# Patient Record
Sex: Male | Born: 1940 | Race: White | Hispanic: No | Smoking: Former smoker
Health system: Southern US, Community
[De-identification: ages and names within clinical notes are randomized; demographics above are authoritative.]

## PROBLEM LIST (undated history)

## (undated) DIAGNOSIS — Z79899 Other long term (current) drug therapy: Secondary | ICD-10-CM

## (undated) DIAGNOSIS — E119 Type 2 diabetes mellitus without complications: Secondary | ICD-10-CM

## (undated) DIAGNOSIS — D649 Anemia, unspecified: Secondary | ICD-10-CM

## (undated) DIAGNOSIS — IMO0002 Reserved for concepts with insufficient information to code with codable children: Secondary | ICD-10-CM

## (undated) DIAGNOSIS — J9600 Acute respiratory failure, unspecified whether with hypoxia or hypercapnia: Secondary | ICD-10-CM

## (undated) DIAGNOSIS — H538 Other visual disturbances: Secondary | ICD-10-CM

## (undated) DIAGNOSIS — K922 Gastrointestinal hemorrhage, unspecified: Secondary | ICD-10-CM

## (undated) DIAGNOSIS — H532 Diplopia: Secondary | ICD-10-CM

## (undated) DIAGNOSIS — G4753 Recurrent isolated sleep paralysis: Secondary | ICD-10-CM

## (undated) DIAGNOSIS — G893 Neoplasm related pain (acute) (chronic): Secondary | ICD-10-CM

## (undated) DIAGNOSIS — E1149 Type 2 diabetes mellitus with other diabetic neurological complication: Secondary | ICD-10-CM

## (undated) DIAGNOSIS — I10 Essential (primary) hypertension: Secondary | ICD-10-CM

## (undated) DIAGNOSIS — E669 Obesity, unspecified: Secondary | ICD-10-CM

## (undated) DIAGNOSIS — C439 Malignant melanoma of skin, unspecified: Secondary | ICD-10-CM

## (undated) DIAGNOSIS — R9439 Abnormal result of other cardiovascular function study: Secondary | ICD-10-CM

## (undated) DIAGNOSIS — E785 Hyperlipidemia, unspecified: Secondary | ICD-10-CM

## (undated) DIAGNOSIS — R6889 Other general symptoms and signs: Secondary | ICD-10-CM

## (undated) DIAGNOSIS — C799 Secondary malignant neoplasm of unspecified site: Secondary | ICD-10-CM

## (undated) DIAGNOSIS — J811 Chronic pulmonary edema: Secondary | ICD-10-CM

## (undated) DIAGNOSIS — J449 Chronic obstructive pulmonary disease, unspecified: Secondary | ICD-10-CM

## (undated) DIAGNOSIS — H02409 Unspecified ptosis of unspecified eyelid: Secondary | ICD-10-CM

## (undated) DIAGNOSIS — C4491 Basal cell carcinoma of skin, unspecified: Secondary | ICD-10-CM

## (undated) DIAGNOSIS — Z85820 Personal history of malignant melanoma of skin: Secondary | ICD-10-CM

## (undated) DIAGNOSIS — Z8719 Personal history of other diseases of the digestive system: Secondary | ICD-10-CM

## (undated) DIAGNOSIS — G7000 Myasthenia gravis without (acute) exacerbation: Secondary | ICD-10-CM

## (undated) DIAGNOSIS — Z9889 Other specified postprocedural states: Secondary | ICD-10-CM

## (undated) DIAGNOSIS — J189 Pneumonia, unspecified organism: Secondary | ICD-10-CM

## (undated) DIAGNOSIS — Z658 Other specified problems related to psychosocial circumstances: Secondary | ICD-10-CM

## (undated) HISTORY — DX: Chronic obstructive pulmonary disease, unspecified: J44.9

## (undated) HISTORY — PX: LITHOTRIPSY: SUR834

## (undated) HISTORY — DX: Anemia, unspecified: D64.9

## (undated) HISTORY — DX: Neoplasm related pain (acute) (chronic): G89.3

## (undated) HISTORY — DX: Pneumonia, unspecified organism: J18.9

## (undated) HISTORY — DX: Other specified postprocedural states: Z98.890

## (undated) HISTORY — DX: Diplopia: H53.2

## (undated) HISTORY — DX: Type 2 diabetes mellitus without complications: E11.9

## (undated) HISTORY — DX: Hyperlipidemia, unspecified: E78.5

## (undated) HISTORY — DX: Obesity, unspecified: E66.9

## (undated) HISTORY — PX: ENTERECTOMY: SHX306

## (undated) HISTORY — DX: Gastrointestinal hemorrhage, unspecified: K92.2

## (undated) HISTORY — DX: Personal history of malignant melanoma of skin: Z85.820

## (undated) HISTORY — DX: Myasthenia gravis without (acute) exacerbation: G70.00

## (undated) HISTORY — DX: Other general symptoms and signs: R68.89

## (undated) HISTORY — DX: Essential (primary) hypertension: I10

## (undated) HISTORY — DX: Personal history of other diseases of the digestive system: Z87.19

## (undated) HISTORY — DX: Secondary malignant neoplasm of unspecified site: C79.9

## (undated) HISTORY — DX: Other long term (current) drug therapy: Z79.899

## (undated) HISTORY — DX: Other visual disturbances: H53.8

## (undated) HISTORY — DX: Reserved for concepts with insufficient information to code with codable children: IMO0002

## (undated) HISTORY — DX: Acute respiratory failure, unspecified whether with hypoxia or hypercapnia: J96.00

## (undated) HISTORY — PX: RENAL ARTERY STENT: SHX2321

## (undated) HISTORY — PX: AXILLARY LYMPH NODE DISSECTION: SHX5229

## (undated) HISTORY — DX: Unspecified ptosis of unspecified eyelid: H02.409

## (undated) HISTORY — DX: Other specified problems related to psychosocial circumstances: Z65.8

## (undated) HISTORY — PX: BASAL CELL CARCINOMA EXCISION: SHX1214

## (undated) HISTORY — DX: Abnormal result of other cardiovascular function study: R94.39

## (undated) HISTORY — DX: Malignant melanoma of skin, unspecified: C43.9

## (undated) HISTORY — DX: Recurrent isolated sleep paralysis: G47.53

## (undated) HISTORY — DX: Basal cell carcinoma of skin, unspecified: C44.91

## (undated) HISTORY — DX: Chronic pulmonary edema: J81.1

## (undated) HISTORY — PX: MELANOMA EXCISION: SHX5266

## (undated) HISTORY — DX: Type 2 diabetes mellitus with other diabetic neurological complication: E11.49

---

## 1997-12-15 ENCOUNTER — Ambulatory Visit: Admission: RE | Admit: 1997-12-15 | Discharge: 1997-12-15 | Payer: Self-pay | Admitting: Family Medicine

## 2006-06-28 ENCOUNTER — Ambulatory Visit (HOSPITAL_COMMUNITY): Admission: RE | Admit: 2006-06-28 | Discharge: 2006-06-28 | Payer: Self-pay | Admitting: Urology

## 2007-01-24 DIAGNOSIS — C439 Malignant melanoma of skin, unspecified: Secondary | ICD-10-CM

## 2007-01-24 DIAGNOSIS — G7000 Myasthenia gravis without (acute) exacerbation: Secondary | ICD-10-CM

## 2007-01-24 HISTORY — DX: Myasthenia gravis without (acute) exacerbation: G70.00

## 2007-01-24 HISTORY — DX: Malignant melanoma of skin, unspecified: C43.9

## 2007-11-15 ENCOUNTER — Ambulatory Visit: Payer: Self-pay | Admitting: Vascular Surgery

## 2008-01-06 ENCOUNTER — Encounter: Admission: RE | Admit: 2008-01-06 | Discharge: 2008-01-06 | Payer: Self-pay | Admitting: Neurology

## 2008-01-13 ENCOUNTER — Ambulatory Visit (HOSPITAL_COMMUNITY): Admission: RE | Admit: 2008-01-13 | Discharge: 2008-01-13 | Payer: Self-pay | Admitting: Oncology

## 2010-06-07 NOTE — Consult Note (Signed)
NEW PATIENT CONSULTATION   Colin Reyes, Colin Reyes  DOB:  1940/03/10                                       11/15/2007  CHART#:[id number]   The patient presents today for evaluation of asymptomatic carotid  disease.  He is a 70 year old gentleman with multiple symptoms.  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  Has multiple complaints  currently.  He reports difficulty swallowing, difficulty with chewing,  diplopia, and blurred vision.  He does have shortness of breath and also  reports positional pain in his neck.   PAST MEDICAL HISTORY:  Significant for non-insulin-dependent diabetes,  hypertension, elevated cholesterol.  He is married with 2 children.  He  does not smoke, having quit in 1992.  Does not drink alcohol on a  regular basis.   REVIEW OF SYSTEMS:  Multiple positives with weight loss, loss of  appetite, shortness of breath with lying flat and with exertion,  difficulty swallowing, diarrhea, urinary frequency, slurred speech,  depression, nervousness, eyesight changes.   ALLERGIES:  Effexor.  Multiple medications, which are attached.   PHYSICAL EXAM:  He is a well developed, well nourished white male  appearing stated age of 16.  He does have generalized weakness.  He does  not have any focal deficits.  His carotid arteries are without bruits  bilaterally.  He has 2+ radial pulses bilaterally.   He had undergone MRA for evaluation of his neck pain and diplopia.  This  had shown internal carotid artery occlusion.  He underwent carotid  duplex in our office today, and this does confirm internal carotid  artery occlusion on the right.  He does have a patent common and  external carotid artery on the right.  On the left, he has no evidence  of significant internal carotid artery stenosis.  I discussed this at  length with the patient and his family present.  I explained that this  does not explain any of the symptoms that he is having, unfortunately.  I explained that this is a stable situation and that he had at some  point an asymptomatic occlusion of his internal carotid artery.  I  explained that since he has had no symptoms, that he clearly has  adequate cross flow from left to right and also from the posterior  circulation.  His duplex did show that he had antegrade vertebral flow  as well.  He is frustrated at the inability to determine the cause of  his multiple symptoms, but I reassured him that this has no relationship  to his carotid disease.  I would not recommend any additional followup  since this is a stable situation.  He will see [place] again on an as needed  basis.   Larina Earthly, M.D.  Electronically Signed   TFE/MEDQ  D:  11/15/2007  T:  11/18/2007  Job:  2012   cc:   Weston Settle, MD  Jacqualine Mau

## 2010-06-07 NOTE — Procedures (Signed)
CAROTID DUPLEX EXAM   INDICATION:  Right ICA occlusion by MRI, hypertension.   HISTORY:  Diabetes:  No.  Cardiac:  No.  Hypertension:  Yes.  Smoking:  Previous.  Previous Surgery:  Lymph nodes removed in left arm.  CV History:  Amaurosis Fugax No, Paresthesias No, Hemiparesis No                                       RIGHT             LEFT  Brachial systolic pressure:         100               110  Brachial Doppler waveforms:         Normal            Normal  Vertebral direction of flow:        Antegrade         Antegrade  DUPLEX VELOCITIES (cm/sec)  CCA peak systolic                   68                86  ECA peak systolic                   92                109  ICA peak systolic                   Occluded          83  ICA end diastolic                                     25  PLAQUE MORPHOLOGY:                  Heterogeneous     Heterogeneous  PLAQUE AMOUNT:                      Occlusive         Mild  PLAQUE LOCATION:                    ICA               ICA    IMPRESSION:  1. Occlusion of the right internal carotid artery.  2. A 1-39% stenosis of the left internal carotid artery.      ___________________________________________  Larina Earthly, M.D.   CH/MEDQ  D:  11/18/2007  T:  11/18/2007  Job:  [id number]

## 2010-10-28 LAB — GLUCOSE, CAPILLARY: Glucose-Capillary: 152 mg/dL — ABNORMAL HIGH (ref 70–99)

## 2010-11-10 LAB — BASIC METABOLIC PANEL
CO2: 29
Chloride: 102
GFR calc Af Amer: >60
Potassium: 4
Sodium: 135

## 2011-12-05 DIAGNOSIS — E1149 Type 2 diabetes mellitus with other diabetic neurological complication: Secondary | ICD-10-CM

## 2011-12-05 DIAGNOSIS — H532 Diplopia: Secondary | ICD-10-CM

## 2011-12-05 DIAGNOSIS — C439 Malignant melanoma of skin, unspecified: Secondary | ICD-10-CM

## 2011-12-05 DIAGNOSIS — H538 Other visual disturbances: Secondary | ICD-10-CM

## 2011-12-05 DIAGNOSIS — C799 Secondary malignant neoplasm of unspecified site: Secondary | ICD-10-CM | POA: Insufficient documentation

## 2011-12-05 DIAGNOSIS — R6889 Other general symptoms and signs: Secondary | ICD-10-CM | POA: Insufficient documentation

## 2011-12-05 DIAGNOSIS — I10 Essential (primary) hypertension: Secondary | ICD-10-CM | POA: Insufficient documentation

## 2011-12-05 DIAGNOSIS — H02409 Unspecified ptosis of unspecified eyelid: Secondary | ICD-10-CM

## 2011-12-05 DIAGNOSIS — G4753 Recurrent isolated sleep paralysis: Secondary | ICD-10-CM | POA: Insufficient documentation

## 2011-12-05 DIAGNOSIS — J449 Chronic obstructive pulmonary disease, unspecified: Secondary | ICD-10-CM

## 2011-12-05 HISTORY — DX: Chronic obstructive pulmonary disease, unspecified: J44.9

## 2011-12-05 HISTORY — DX: Other visual disturbances: H53.8

## 2011-12-05 HISTORY — DX: Malignant melanoma of skin, unspecified: C43.9

## 2011-12-05 HISTORY — DX: Unspecified ptosis of unspecified eyelid: H02.409

## 2011-12-05 HISTORY — DX: Recurrent isolated sleep paralysis: G47.53

## 2011-12-05 HISTORY — DX: Type 2 diabetes mellitus with other diabetic neurological complication: E11.49

## 2011-12-05 HISTORY — DX: Other general symptoms and signs: R68.89

## 2011-12-05 HISTORY — DX: Secondary malignant neoplasm of unspecified site: C79.9

## 2011-12-05 HISTORY — DX: Diplopia: H53.2

## 2011-12-05 HISTORY — DX: Essential (primary) hypertension: I10

## 2012-05-21 ENCOUNTER — Other Ambulatory Visit: Payer: Self-pay

## 2012-05-21 MED ORDER — PYRIDOSTIGMINE BROMIDE 60 MG PO TABS: 60.0000 mg | ORAL_TABLET | Freq: Three times a day (TID) | ORAL | Status: DC

## 2012-05-21 NOTE — Telephone Encounter (Signed)
Former Dr Sandria Manly patient assigned to Dr Anne Hahn.

## 2012-06-13 ENCOUNTER — Ambulatory Visit (INDEPENDENT_AMBULATORY_CARE_PROVIDER_SITE_OTHER): Payer: No Typology Code available for payment source | Admitting: Neurology

## 2012-06-13 ENCOUNTER — Encounter: Payer: Self-pay | Admitting: Neurology

## 2012-06-13 VITALS — BP 121/62 | HR 61 | Wt 210.0 lb

## 2012-06-13 DIAGNOSIS — H538 Other visual disturbances: Secondary | ICD-10-CM

## 2012-06-13 DIAGNOSIS — G4753 Recurrent isolated sleep paralysis: Secondary | ICD-10-CM

## 2012-06-13 DIAGNOSIS — I10 Essential (primary) hypertension: Secondary | ICD-10-CM

## 2012-06-13 DIAGNOSIS — G7000 Myasthenia gravis without (acute) exacerbation: Secondary | ICD-10-CM

## 2012-06-13 DIAGNOSIS — J449 Chronic obstructive pulmonary disease, unspecified: Secondary | ICD-10-CM

## 2012-06-13 DIAGNOSIS — R6889 Other general symptoms and signs: Secondary | ICD-10-CM

## 2012-06-13 DIAGNOSIS — C439 Malignant melanoma of skin, unspecified: Secondary | ICD-10-CM

## 2012-06-13 DIAGNOSIS — E1149 Type 2 diabetes mellitus with other diabetic neurological complication: Secondary | ICD-10-CM

## 2012-06-13 DIAGNOSIS — H02409 Unspecified ptosis of unspecified eyelid: Secondary | ICD-10-CM

## 2012-06-13 DIAGNOSIS — H532 Diplopia: Secondary | ICD-10-CM

## 2012-06-13 NOTE — Progress Notes (Signed)
Reason for visit: Myasthenia gravis  Colin Reyes is an 72 y.o. male  History of present illness:  Colin Reyes is a 72 year old right-handed white male with a history of myasthenia gravis. The patient presented with a "dropped head syndrome", dysphagia, and some generalized weakness along with double vision and chewing difficulties. The patient had difficulty moving his tongue. The patient presented in 2009, and he is been treated solely with Mestinon. The patient has done relatively well, and he has been stable for a number of years. The patient has had some issues with a chest wall melanoma that has been treated through [facility]. The patient has had a good response to chemotherapy. The patient has had some bowel issues on Mestinon, and he is taking hyoscyamine for this. The patient indicates that he has fatigue with holding up his head, and he will have to rest at times to keep his head up. The patient reports some intermittent left-sided neck and shoulder discomfort that is neuromuscular in nature. The patient takes narcotic medications for pain for his chest wall pain and neck pain. The patient denies any issues with falls or stumbles. The patient indicates that he is able to climb stairs well. The patient denies any dysphagia at this time.  Past Medical History  Diagnosis Date  . Hypertension   . Diabetes mellitus   . Melanoma 2009    Left chest wall   . Myasthenia gravis 2009  . Obesity     Past Surgical History  Procedure Laterality Date  . Axillary lymph node dissection      Family History  Problem Relation Age of Onset  . Cancer - Lung Father     Social history:  reports that he quit smoking about 22 years ago. He does not have any smokeless tobacco history on file. He reports that he does not drink alcohol or use illicit drugs.  Allergies:  Allergies  Allergen Reactions  . Effexor (Venlafaxine Hcl)     Medications:  Current Outpatient Prescriptions on File Prior  to Visit  Medication Sig Dispense Refill  . pyridostigmine (MESTINON) 60 MG tablet Take 1 tablet (60 mg total) by mouth 3 (three) times daily.  270 tablet  1   No current facility-administered medications on file prior to visit.    ROS:  Out of a complete 14 system review of symptoms, the patient complains only of the following symptoms, and all other reviewed systems are negative.  Fatigue Swelling in the legs Ringing in the ears Shortness of breath Joint pain Skin sensitivity  Blood pressure 121/62, pulse 61, weight 210 lb (95.255 kg).  Physical Exam  General: The patient is alert and cooperative at the time of the examination. The patient is moderately obese.  Skin: 1+ edema was seen at the ankles bilaterally.   Neurologic Exam  Cranial nerves: Facial symmetry is present. Speech is normal, no aphasia or dysarthria is noted. Extraocular movements are full. Visual fields are full. With superior gaze for 1 minute, there is no ptosis or divergence of gaze. The patient does not report double vision.  Motor: The patient has good strength in all 4 extremities. With arms abducted for 1 minute, there is mild fatigable weakness in the left deltoid.  Coordination: The patient has good finger-nose-finger and heel-to-shin bilaterally.  Gait and station: The patient has a normal gait. Tandem gait is slightly unsteady. Romberg is negative. No drift is seen.  Reflexes: Deep tendon reflexes are symmetric, but the ankle jerk  reflexes are depressed bilaterally.   Assessment/Plan:  One. Myasthenia gravis  2. Chest wall melanoma  The patient is doing relatively well at this point solely on Mestinon. We will continue the medication for now. The patient followup in 6 months.  Marlan Palau MD 06/14/2012 11:00 AM  [facility] [address] [place], [place] [address]  Phone [phone] Fax [phone]

## 2012-07-17 ENCOUNTER — Telehealth: Payer: Self-pay

## 2012-07-17 MED ORDER — HYOSCYAMINE SULFATE 0.125 MG PO TBDP: 0.1250 mg | ORAL_TABLET | Freq: Two times a day (BID) | ORAL | Status: DC

## 2012-07-17 MED ORDER — PYRIDOSTIGMINE BROMIDE 60 MG PO TABS: 60.0000 mg | ORAL_TABLET | Freq: Three times a day (TID) | ORAL | Status: DC

## 2012-07-17 NOTE — Telephone Encounter (Signed)
Former [facility] patient assigned to Dr Anne Hahn.  He called because Dr Sandria Manly always prescribed generic Anaspaz for him and he needs a refill.

## 2012-07-23 ENCOUNTER — Other Ambulatory Visit: Payer: Self-pay

## 2012-07-23 MED ORDER — HYOSCYAMINE SULFATE 0.125 MG PO TBDP: 0.1250 mg | ORAL_TABLET | Freq: Two times a day (BID) | ORAL | Status: DC

## 2012-07-23 NOTE — Telephone Encounter (Signed)
Patient called, left message asking that [facility] due to cost.

## 2012-08-28 ENCOUNTER — Other Ambulatory Visit: Payer: Self-pay

## 2012-09-30 DIAGNOSIS — J811 Chronic pulmonary edema: Secondary | ICD-10-CM | POA: Insufficient documentation

## 2012-09-30 DIAGNOSIS — E119 Type 2 diabetes mellitus without complications: Secondary | ICD-10-CM | POA: Insufficient documentation

## 2012-09-30 DIAGNOSIS — J189 Pneumonia, unspecified organism: Secondary | ICD-10-CM | POA: Insufficient documentation

## 2012-09-30 DIAGNOSIS — G7000 Myasthenia gravis without (acute) exacerbation: Secondary | ICD-10-CM | POA: Insufficient documentation

## 2012-09-30 DIAGNOSIS — Z85820 Personal history of malignant melanoma of skin: Secondary | ICD-10-CM | POA: Insufficient documentation

## 2012-09-30 DIAGNOSIS — Z8719 Personal history of other diseases of the digestive system: Secondary | ICD-10-CM

## 2012-09-30 DIAGNOSIS — J9600 Acute respiratory failure, unspecified whether with hypoxia or hypercapnia: Secondary | ICD-10-CM

## 2012-09-30 DIAGNOSIS — Z9889 Other specified postprocedural states: Secondary | ICD-10-CM

## 2012-09-30 HISTORY — DX: Type 2 diabetes mellitus without complications: E11.9

## 2012-09-30 HISTORY — DX: Pneumonia, unspecified organism: J18.9

## 2012-09-30 HISTORY — DX: Personal history of other diseases of the digestive system: Z87.19

## 2012-09-30 HISTORY — DX: Other specified postprocedural states: Z98.890

## 2012-09-30 HISTORY — DX: Personal history of malignant melanoma of skin: Z85.820

## 2012-09-30 HISTORY — DX: Acute respiratory failure, unspecified whether with hypoxia or hypercapnia: J96.00

## 2012-09-30 HISTORY — DX: Chronic pulmonary edema: J81.1

## 2012-10-27 HISTORY — PX: UMBILICAL HERNIA REPAIR: SHX196

## 2012-11-28 ENCOUNTER — Other Ambulatory Visit: Payer: Self-pay

## 2012-12-15 ENCOUNTER — Other Ambulatory Visit: Payer: Self-pay

## 2012-12-15 MED ORDER — PYRIDOSTIGMINE BROMIDE 60 MG PO TABS: 60.0000 mg | ORAL_TABLET | Freq: Three times a day (TID) | ORAL | Status: DC

## 2012-12-16 ENCOUNTER — Encounter: Payer: Self-pay | Admitting: Neurology

## 2012-12-16 ENCOUNTER — Ambulatory Visit (INDEPENDENT_AMBULATORY_CARE_PROVIDER_SITE_OTHER): Payer: No Typology Code available for payment source | Admitting: Neurology

## 2012-12-16 VITALS — BP 121/64 | HR 63 | Ht 69.0 in | Wt 201.0 lb

## 2012-12-16 DIAGNOSIS — G7000 Myasthenia gravis without (acute) exacerbation: Secondary | ICD-10-CM

## 2012-12-16 MED ORDER — PYRIDOSTIGMINE BROMIDE ER 180 MG PO TBCR: 180.0000 mg | EXTENDED_RELEASE_TABLET | Freq: Every day | ORAL | Status: DC

## 2012-12-16 NOTE — Progress Notes (Signed)
Reason for visit: Myasthenia gravis  Colin Reyes is an 72 y.o. male  History of present illness:  Colin Reyes is a 72 year old right-handed white male with a history of myasthenia gravis that initially presented with a "dropped head syndrome". The patient has been on Mestinon taking 180 mg tablet at night, and 60 mg 3 times daily. The patient takes hyoscyamine once a day to help prevent diarrhea. The patient recently was in hospital for an umbilical hernia repair. The patient received paralytics with the surgery, and he had a protracted hospitalization secondary to difficulty weaning him from the ventilator. The patient went to [facility] following the surgery. The patient has recovered completely. Recently, the patient had resection of basal cell carcinoma on the lower extremities. The patient otherwise is doing well at this time with the myasthenia gravis without double vision, ptosis, difficulty chewing or swallowing, or weakness of the extremities. The patient returns for an evaluation.  Past Medical History  Diagnosis Date  . Hypertension   . Diabetes mellitus   . Melanoma 2009    Left chest wall   . Myasthenia gravis 2009  . Obesity     Past Surgical History  Procedure Laterality Date  . Axillary lymph node dissection    . Umbilical hernia repair  10/27/12  . Basal cell carcinoma excision  11/08/12, 11/29/12    Family History  Problem Relation Age of Onset  . Cancer - Lung Father     Social history:  reports that he quit smoking about 22 years ago. He does not have any smokeless tobacco history on file. He reports that he does not drink alcohol or use illicit drugs.    Allergies  Allergen Reactions  . Effexor [Venlafaxine Hcl]     Medications:  Current Outpatient Prescriptions on File Prior to Visit  Medication Sig Dispense Refill  . ADVAIR DISKUS 250-50 MCG/DOSE AEPB Inhale 250 puffs into the lungs 2 (two) times daily.      Marland Kitchen atenolol (TENORMIN) 50 MG tablet Take 50  mg by mouth daily.      . AVODART 0.5 MG capsule Take 0.5 mg by mouth daily.      . Cholecalciferol (VITAMIN D) 2000 UNITS CAPS Take 2,000 Units by mouth daily.      Marland Kitchen glipiZIDE (GLUCOTROL XL) 10 MG 24 hr tablet Take 10 mg by mouth 2 (two) times daily.      . hydrochlorothiazide (MICROZIDE) 12.5 MG capsule Take 12.5 mg by mouth daily.      . hyoscyamine (ANASPAZ) 0.125 MG TBDP Place 1 tablet (0.125 mg total) under the tongue 2 (two) times daily.  180 tablet  1  . LORazepam (ATIVAN) 1 MG tablet Take 1 mg by mouth daily.      Marland Kitchen losartan (COZAAR) 50 MG tablet Take 50 mg by mouth daily.      Marland Kitchen morphine (MS CONTIN) 15 MG 12 hr tablet Take 15 mg by mouth 2 (two) times daily.       . ONE TOUCH ULTRA TEST test strip       . oxyCODONE-acetaminophen (PERCOCET/ROXICET) 5-325 MG per tablet Take 5-325 tablets by mouth as needed.      . pyridostigmine (MESTINON) 60 MG tablet Take 1 tablet (60 mg total) by mouth 3 (three) times daily.  270 tablet  1  . simvastatin (ZOCOR) 20 MG tablet Take 20 mg by mouth daily.      Marland Kitchen terazosin (HYTRIN) 1 MG capsule Take 1 mg by mouth  daily.       No current facility-administered medications on file prior to visit.    ROS:  Out of a complete 14 system review of symptoms, the patient complains only of the following symptoms, and all other reviewed systems are negative.  Swelling the legs Easy bruising  Blood pressure 121/64, pulse 63, height 5\' 9"  (1.753 m), weight 201 lb (91.173 kg).  Physical Exam  General: The patient is alert and cooperative at the time of the examination.  Skin: 1-2+ edema of ankles is noted bilaterally.   Neurologic Exam  Mental status: The patient is oriented x 3.  Cranial nerves: Facial symmetry is present. Speech is normal, no aphasia or dysarthria is noted. Extraocular movements are full. Visual fields are full. With superior gaze for 1 minute, no double vision or ptosis is noted. The patient has good strength with jaw opening and  closure, and good facial muscle strength, and good strength with hip flexion and extension.  Motor: The patient has good strength in all 4 extremities. With the arms outstretched for 1 minute, no fatigable weakness of the deltoid muscles was noted.  Sensory examination: Soft touch sensation on the face and arms is symmetric.  Coordination: The patient has good finger-nose-finger and heel-to-shin bilaterally.  Gait and station: The patient has a normal gait. Tandem gait is slightly unsteady. Romberg is negative. No drift is seen.  Reflexes: Deep tendon reflexes are symmetric.   Assessment/Plan:  One. Myasthenia gravis  The patient doing well at this time. The patient will be continued on his Mestinon taking 60 mg 3 times daily, and 180 mg at night. The patient will followup through this office in about 6-7 months. The patient is being considered for a shingles vaccination, which is not contraindicated with his history of myasthenia gravis.  Colin Palau MD 12/16/2012 9:24 AM  [facility] [address] [place], [place] [address]  Phone [phone] Fax [phone]

## 2012-12-16 NOTE — Patient Instructions (Signed)
Myasthenia Gravis Myasthenia gravis is a disease that causes muscle weakness throughout the body. The muscles affected are the ones we can control (voluntary muscles). An example of a voluntary muscle is your hand muscles. You can control the muscles to make the hand pick something up. An example of an involuntary muscle is the heart. The heart beats without any direction from you.  Myasthenia Gravis is thought to be an autoimmune disease. That means that normal defenses of the body begin to attack the body. In this case, the immune system begins to attack cells located at the junctions of the muscles and the nerves. Women are affected more often. Women are affected at a younger age than men. Babies born to affected women frequently develop symptoms at an early age. SYMPTOMS Initially in the disease, the facial muscles are affected first. After this, a person may develop droopy eyelids. They may have difficulty controlling facial muscles. They may have problems chewing. Swallowing and speaking may become impaired. The weakness gradually spreads to the arms and legs. It begins to affect breathing. Sometimes, the symptoms lessen or go away without any apparent cause. DIAGNOSIS  Diagnosis can be made with blood tests. Tests such as electromyography may be done to examine the electrical activity in the muscle. An improvement in symptoms after having an anti-cholinesterase drug helps confirm the diagnosis.  TREATMENT  Medicines are usually prescribed as the first treatment. These medicines help, but they do not cure the disease. A plasma cleansing procedure (plasmapheresis) can be used to treat a crisis. It can also be used to prepare a person for surgery. This procedure produces short-term improvement. Some cases are helped by removing the thymus gland. Steroids are used for short-term benefits. Document Released: 04/17/2000 Document Revised: 04/03/2011 Document Reviewed: 01/09/2005 [employer] Patient  Information 2014 [employer].  

## 2013-03-25 ENCOUNTER — Telehealth: Payer: Self-pay | Admitting: Neurology

## 2013-03-25 MED ORDER — PYRIDOSTIGMINE BROMIDE ER 180 MG PO TBCR: 180.0000 mg | EXTENDED_RELEASE_TABLET | Freq: Every day | ORAL | Status: DC

## 2013-03-25 MED ORDER — PYRIDOSTIGMINE BROMIDE 60 MG PO TABS: 60.0000 mg | ORAL_TABLET | Freq: Three times a day (TID) | ORAL | Status: DC

## 2013-03-25 NOTE — Telephone Encounter (Signed)
Pt called states he needs to get his refill on his pyridostigmine (MESTINON) 180 MG CR tablet. Pt states he [facility] longer gets [facility] I have went under demographics and changed the pharmacy. Pt wants someone to call him once this is has been sent to the pharmacy.

## 2013-03-25 NOTE — Telephone Encounter (Signed)
Rx has been sent.  I called the patient back.  He is aware [facility].

## 2013-06-18 ENCOUNTER — Ambulatory Visit (INDEPENDENT_AMBULATORY_CARE_PROVIDER_SITE_OTHER): Payer: Medicare HMO | Admitting: Neurology

## 2013-06-18 ENCOUNTER — Encounter: Payer: Self-pay | Admitting: Neurology

## 2013-06-18 VITALS — BP 122/65 | HR 62 | Wt 198.0 lb

## 2013-06-18 DIAGNOSIS — G7000 Myasthenia gravis without (acute) exacerbation: Secondary | ICD-10-CM

## 2013-06-18 NOTE — Progress Notes (Signed)
Reason for visit: Myasthenia gravis  Colin Reyes is an 73 y.o. male  History of present illness:  Colin Reyes is a 73 year old right-handed white male with a history of myasthenia gravis that presented with a "dropped head syndrome". Currently, the patient is being treated only with Mestinon. He has a history of a lymphoma involving the left arm, and he has some chronic discomfort involving the left axillary area. The patient also reports some left shoulder discomfort if he has been physically active for a period of time. He indicates that there is no pain going down the left arm. He occasionally will note some ptosis of the eyes, but this does not shut down the eyes completely. He rarely has issues with double vision. He reports no problems with chewing or swallowing. He denies any further issues with the "dropped head syndrome". He returns to the office today for an evaluation. No other new medical issues have come up since last seen.   Past Medical History  Diagnosis Date  . Hypertension   . Diabetes mellitus   . Melanoma 2009    Left chest wall   . Myasthenia gravis 2009  . Obesity   . Dyslipidemia     Past Surgical History  Procedure Laterality Date  . Axillary lymph node dissection    . Umbilical hernia repair  10/27/12  . Basal cell carcinoma excision  11/08/12, 11/29/12    Family History  Problem Relation Age of Onset  . Cancer - Lung Father   . Cancer - Prostate Brother     Social history:  reports that he quit smoking about 23 years ago. He does not have any smokeless tobacco history on file. He reports that he does not drink alcohol or use illicit drugs.    Allergies  Allergen Reactions  . Effexor [Venlafaxine Hcl]     Medications:  Current Outpatient Prescriptions on File Prior to Visit  Medication Sig Dispense Refill  . ADVAIR DISKUS 250-50 MCG/DOSE AEPB Inhale 250 puffs into the lungs 2 (two) times daily.      Marland Kitchen atenolol (TENORMIN) 50 MG tablet Take 50  mg by mouth daily.      . AVODART 0.5 MG capsule Take 0.5 mg by mouth daily.      . Cholecalciferol (VITAMIN D) 2000 UNITS CAPS Take 2,000 Units by mouth daily.      Marland Kitchen glipiZIDE (GLUCOTROL XL) 10 MG 24 hr tablet Take 10 mg by mouth 2 (two) times daily.      . hydrochlorothiazide (MICROZIDE) 12.5 MG capsule Take 12.5 mg by mouth daily.      . hyoscyamine (ANASPAZ) 0.125 MG TBDP Place 1 tablet (0.125 mg total) under the tongue 2 (two) times daily.  180 tablet  1  . LORazepam (ATIVAN) 1 MG tablet Take 1 mg by mouth daily.      Marland Kitchen losartan (COZAAR) 50 MG tablet Take 50 mg by mouth daily.      Marland Kitchen morphine (MS CONTIN) 15 MG 12 hr tablet Take 15 mg by mouth 2 (two) times daily.       . ONE TOUCH ULTRA TEST test strip       . oxyCODONE-acetaminophen (PERCOCET/ROXICET) 5-325 MG per tablet Take 5-325 tablets by mouth as needed.      . Probiotic Product (PROBIOTIC DAILY PO) Take by mouth daily.      Marland Kitchen pyridostigmine (MESTINON) 180 MG CR tablet Take 1 tablet (180 mg total) by mouth at bedtime.  90 tablet  1  . pyridostigmine (MESTINON) 60 MG tablet Take 1 tablet (60 mg total) by mouth 3 (three) times daily.  270 tablet  1  . Saxagliptin-Metformin (KOMBIGLYZE XR) 2.05-998 MG TB24 Take 2.5 mg by mouth daily.      . simvastatin (ZOCOR) 20 MG tablet Take 20 mg by mouth daily.      Marland Kitchen terazosin (HYTRIN) 1 MG capsule Take 1 mg by mouth daily.       No current facility-administered medications on file prior to visit.    ROS:  Out of a complete 14 system review of symptoms, the patient complains only of the following symptoms, and all other reviewed systems are negative.  Ringing in the ears  Leg swelling Dizziness, weakness Neck pain Easy bruising  Blood pressure 122/65, pulse 62, weight 198 lb (89.812 kg).  Physical Exam  General: The patient is alert and cooperative at the time of the examination.  Neuromuscular: Range of movement of the cervical spine lacks about 10 of full lateral rotation  bilaterally.  Skin: No significant peripheral edema is noted.   Neurologic Exam  Mental status: The patient is oriented x 3.  Cranial nerves: Facial symmetry is present. Speech is normal, no aphasia or dysarthria is noted. Extraocular movements are full. Visual fields are full. With superior gaze for 1 minute, the patient reports some subjective double vision after 45 seconds, no ptosis is seen. The patient has good strength of the facial muscles, and the muscles with head flexion and extension. Jaw muscle strength is normal.  Motor: The patient has good strength in all 4 extremities.  With the arms outstretched for 1 minute, no fatigable weakness of the deltoid muscles is seen on either side.   Sensory examination: soft touch sensation is symmetric on the face, arms, and legs.   Coordination: The patient has good finger-nose-finger and heel-to-shin bilaterally.  Gait and station: The patient has a normal gait. Tandem gait is normal. Romberg is negative. No drift is seen.  Reflexes: Deep tendon reflexes are symmetric.   Assessment/Plan:  One. Myasthenia gravis  The patient is stable with his myasthenia currently. He will continue his current dosing of the Mestinon. The patient will followup in 6 months. The left-sided neck pain issues are likely not related to the myasthenia gravis.   Colin Alexanders MD 06/18/2013 9:16 AM  [facility] [address] [place], [place] [address]  Phone [phone] Fax [phone]

## 2013-06-18 NOTE — Patient Instructions (Signed)
Myasthenia Gravis Myasthenia gravis is a disease that causes muscle weakness throughout the body. The muscles affected are the ones we can control (voluntary muscles). An example of a voluntary muscle is your hand muscles. You can control the muscles to make the hand pick something up. An example of an involuntary muscle is the heart. The heart beats without any direction from you.  Myasthenia Gravis is thought to be an autoimmune disease. That means that normal defenses of the body begin to attack the body. In this case, the immune system begins to attack cells located at the junctions of the muscles and the nerves. Women are affected more often. Women are affected at a younger age than men. Babies born to affected women frequently develop symptoms at an early age. SYMPTOMS Initially in the disease, the facial muscles are affected first. After this, a person may develop droopy eyelids. They may have difficulty controlling facial muscles. They may have problems chewing. Swallowing and speaking may become impaired. The weakness gradually spreads to the arms and legs. It begins to affect breathing. Sometimes, the symptoms lessen or go away without any apparent cause. DIAGNOSIS  Diagnosis can be made with blood tests. Tests such as electromyography may be done to examine the electrical activity in the muscle. An improvement in symptoms after having an anti-cholinesterase drug helps confirm the diagnosis.  TREATMENT  Medicines are usually prescribed as the first treatment. These medicines help, but they do not cure the disease. A plasma cleansing procedure (plasmapheresis) can be used to treat a crisis. It can also be used to prepare a person for surgery. This procedure produces short-term improvement. Some cases are helped by removing the thymus gland. Steroids are used for short-term benefits. Document Released: 04/17/2000 Document Revised: 04/03/2011 Document Reviewed: 01/09/2005 [employer] Patient  Information 2014 [employer].  

## 2013-06-30 ENCOUNTER — Telehealth: Payer: Self-pay | Admitting: Neurology

## 2013-06-30 NOTE — Telephone Encounter (Signed)
[facility] calling [facility] [address] -[phone]

## 2013-07-07 MED ORDER — HYOSCYAMINE SULFATE 0.125 MG PO TBDP: 0.1250 mg | ORAL_TABLET | Freq: Two times a day (BID) | ORAL | Status: DC

## 2013-07-07 NOTE — Telephone Encounter (Signed)
I have been out of the office since 06/06.  Dr Erling Cruz had been prescribing this med since 2012 per [facility] notes.

## 2013-09-25 ENCOUNTER — Other Ambulatory Visit: Payer: Self-pay | Admitting: Neurology

## 2013-10-14 ENCOUNTER — Other Ambulatory Visit: Payer: Self-pay | Admitting: Neurology

## 2013-10-17 ENCOUNTER — Other Ambulatory Visit: Payer: Self-pay | Admitting: Neurology

## 2013-12-23 ENCOUNTER — Encounter: Payer: Self-pay | Admitting: Neurology

## 2013-12-23 ENCOUNTER — Ambulatory Visit (INDEPENDENT_AMBULATORY_CARE_PROVIDER_SITE_OTHER): Payer: Medicare HMO | Admitting: Neurology

## 2013-12-23 VITALS — BP 147/63 | HR 60 | Ht 69.0 in | Wt 201.8 lb

## 2013-12-23 DIAGNOSIS — G7000 Myasthenia gravis without (acute) exacerbation: Secondary | ICD-10-CM

## 2013-12-23 NOTE — Progress Notes (Signed)
Reason for visit: Myasthenia gravis  Colin Reyes is an 73 y.o. male  History of present illness:  Colin Reyes is a 73 year old right-handed white male with a history of myasthenia gravis primarily with ocular features. The patient is on Mestinon taking the sustained release 180 mg tablet at night, and then taking 60 mg of the short acting Mestinon 3 times during the daytime. The patient indicates that on this medication he began having diarrhea, and he started hyoscyamine for the diarrhea which has been effective. The patient does have episodes of ptosis, and occasional double vision. The patient rarely indicates that the issues impact his ability to drive or to read or watch TV. The patient does have some neck discomfort that begins if he does not support his head after about 15 minutes. The patient has good strength on the arms and legs, he does report some generalized fatigue issues. He denies problems with chewing or swallowing.  Past Medical History  Diagnosis Date  . Hypertension   . Diabetes mellitus   . Melanoma 2009    Left chest wall   . Myasthenia gravis 2009  . Obesity   . Dyslipidemia     Past Surgical History  Procedure Laterality Date  . Axillary lymph node dissection    . Umbilical hernia repair  10/27/12  . Basal cell carcinoma excision  11/08/12, 11/29/12    Family History  Problem Relation Age of Onset  . Cancer - Lung Father   . Cancer - Prostate Brother     Social history:  reports that he quit smoking about 23 years ago. He has never used smokeless tobacco. He reports that he does not drink alcohol or use illicit drugs.    Allergies  Allergen Reactions  . Effexor [Venlafaxine Hcl]     Medications:  Current Outpatient Prescriptions on File Prior to Visit  Medication Sig Dispense Refill  . ADVAIR DISKUS 250-50 MCG/DOSE AEPB Inhale 250 puffs into the lungs 2 (two) times daily.    Marland Kitchen atenolol (TENORMIN) 50 MG tablet Take 50 mg by mouth daily.    .  AVODART 0.5 MG capsule Take 0.5 mg by mouth daily.    . Cholecalciferol (VITAMIN D) 2000 UNITS CAPS Take 2,000 Units by mouth daily.    Marland Kitchen glipiZIDE (GLUCOTROL XL) 10 MG 24 hr tablet Take 10 mg by mouth 2 (two) times daily.    . hydrochlorothiazide (MICROZIDE) 12.5 MG capsule Take 12.5 mg by mouth daily.    . JENTADUETO 2.05-998 MG TABS Take 2.5 mg by mouth daily.    Marland Kitchen LORazepam (ATIVAN) 1 MG tablet Take 1 mg by mouth daily.    Marland Kitchen losartan (COZAAR) 50 MG tablet Take 50 mg by mouth daily.    . MESTINON 180 MG CR tablet TAKE 1 TABLET BY MOUTH EVERY DAY AT BEDTIME 90 tablet 1  . morphine (MS CONTIN) 15 MG 12 hr tablet Take 15 mg by mouth 2 (two) times daily.     . ONE TOUCH ULTRA TEST test strip     . oxyCODONE-acetaminophen (PERCOCET/ROXICET) 5-325 MG per tablet Take 5-325 tablets by mouth as needed.    . Probiotic Product (PROBIOTIC DAILY PO) Take by mouth daily.    Marland Kitchen pyridostigmine (MESTINON) 60 MG tablet TAKE 1 TABLET THREE TIMES A DAY 270 tablet 0  . simvastatin (ZOCOR) 20 MG tablet Take 20 mg by mouth daily.    Marland Kitchen terazosin (HYTRIN) 1 MG capsule Take 1 mg by mouth daily.  No current facility-administered medications on file prior to visit.    ROS:  Out of a complete 14 system review of symptoms, the patient complains only of the following symptoms, and all other reviewed systems are negative.  Leg swelling Back pain, neck pain Bruising easily  Blood pressure 147/63, pulse 60, height 5\' 9"  (1.753 m), weight 201 lb 12.8 oz (91.536 kg).  Physical Exam  General: The patient is alert and cooperative at the time of the examination.  Skin: No significant peripheral edema is noted.   Neurologic Exam  Mental status: The patient is oriented x 3.  Cranial nerves: Facial symmetry is present. Speech is normal, no aphasia or dysarthria is noted. Extraocular movements are full. Visual fields are full. With superior gaze for 1 minute, the patient reports subjective double vision at 30  seconds. No change in ptosis is seen.  Motor: The patient has good strength in all 4 extremities. With arms outstretched 1 minute, no fatigable weakness of the deltoid muscles was noted.  Sensory examination: Soft touch sensation is symmetric on the face, arms, and legs.  Coordination: The patient has good finger-nose-finger and heel-to-shin bilaterally.  Gait and station: The patient has a normal gait. Tandem gait is unsteady. Romberg is negative. No drift is seen.  Reflexes: Deep tendon reflexes are symmetric.   Assessment/Plan:  1. Ocular myasthenia gravis  The patient reports that he is on hyoscyamine for diarrhea that is secondary to the Mestinon. The hyoscyamine is anticholinergic, the Mestinon is cholinergic. The patient is to stop the hyoscyamine. If the diarrhea returns, the Mestinon dosing may be decreased to the point where the diarrhea improves. The patient will follow-up in 6 months.    Jill Alexanders MD 12/23/2013 4:48 PM  [facility] [address] [place], [place] [address]  Phone [phone] Fax [phone]

## 2013-12-23 NOTE — Patient Instructions (Signed)
Myasthenia Gravis Myasthenia gravis is a disease that causes muscle weakness throughout the body. The muscles affected are the ones we can control (voluntary muscles). An example of a voluntary muscle is your hand muscles. You can control the muscles to make the hand pick something up. An example of an involuntary muscle is the heart. The heart beats without any direction from you.  Myasthenia Gravis is thought to be an autoimmune disease. That means that normal defenses of the body begin to attack the body. In this case, the immune system begins to attack cells located at the junctions of the muscles and the nerves. Women are affected more often. Women are affected at a younger age than men. Babies born to affected women frequently develop symptoms at an early age. SYMPTOMS Initially in the disease, the facial muscles are affected first. After this, a person may develop droopy eyelids. They may have difficulty controlling facial muscles. They may have problems chewing. Swallowing and speaking may become impaired. The weakness gradually spreads to the arms and legs. It begins to affect breathing. Sometimes, the symptoms lessen or go away without any apparent cause. DIAGNOSIS  Diagnosis can be made with blood tests. Tests such as electromyography may be done to examine the electrical activity in the muscle. An improvement in symptoms after having an anti-cholinesterase drug helps confirm the diagnosis.  TREATMENT  Medicines are usually prescribed as the first treatment. These medicines help, but they do not cure the disease. A plasma cleansing procedure (plasmapheresis) can be used to treat a crisis. It can also be used to prepare a person for surgery. This procedure produces short-term improvement. Some cases are helped by removing the thymus gland. Steroids are used for short-term benefits. Document Released: 04/17/2000 Document Revised: 04/03/2011 Document Reviewed: 03/12/2013 [employer] Patient  Information 2015 [employer]. This information is not intended to replace advice given to you by your health care provider. Make sure you discuss any questions you have with your health care provider.  

## 2014-01-09 ENCOUNTER — Telehealth: Payer: Self-pay | Admitting: Neurology

## 2014-01-13 MED ORDER — PYRIDOSTIGMINE BROMIDE 60 MG PO TABS: 60.0000 mg | ORAL_TABLET | Freq: Three times a day (TID) | ORAL | Status: DC

## 2014-01-13 NOTE — Telephone Encounter (Signed)
Rx has been sent.  I called patient.  He is aware.

## 2014-01-13 NOTE — Telephone Encounter (Signed)
Patient requesting Rx refill for pyridostigmine (MESTINON) 60 MG tablet.  Pharmacy stated no refills were available.  Please call and advise

## 2014-01-30 ENCOUNTER — Other Ambulatory Visit: Payer: Self-pay | Admitting: Neurology

## 2014-06-23 ENCOUNTER — Ambulatory Visit (INDEPENDENT_AMBULATORY_CARE_PROVIDER_SITE_OTHER): Payer: PPO | Admitting: Neurology

## 2014-06-23 ENCOUNTER — Encounter: Payer: Self-pay | Admitting: Neurology

## 2014-06-23 VITALS — BP 145/69 | HR 63 | Ht 69.0 in | Wt 198.2 lb

## 2014-06-23 DIAGNOSIS — G7000 Myasthenia gravis without (acute) exacerbation: Secondary | ICD-10-CM

## 2014-06-23 NOTE — Patient Instructions (Signed)
Myasthenia Gravis Myasthenia gravis is a disease that causes muscle weakness throughout the body. The muscles affected are the ones we can control (voluntary muscles). An example of a voluntary muscle is your hand muscles. You can control the muscles to make the hand pick something up. An example of an involuntary muscle is the heart. The heart beats without any direction from you.  Myasthenia Gravis is thought to be an autoimmune disease. That means that normal defenses of the body begin to attack the body. In this case, the immune system begins to attack cells located at the junctions of the muscles and the nerves. Women are affected more often. Women are affected at a younger age than men. Babies born to affected women frequently develop symptoms at an early age. SYMPTOMS Initially in the disease, the facial muscles are affected first. After this, a person may develop droopy eyelids. They may have difficulty controlling facial muscles. They may have problems chewing. Swallowing and speaking may become impaired. The weakness gradually spreads to the arms and legs. It begins to affect breathing. Sometimes, the symptoms lessen or go away without any apparent cause. DIAGNOSIS  Diagnosis can be made with blood tests. Tests such as electromyography may be done to examine the electrical activity in the muscle. An improvement in symptoms after having an anti-cholinesterase drug helps confirm the diagnosis.  TREATMENT  Medicines are usually prescribed as the first treatment. These medicines help, but they do not cure the disease. A plasma cleansing procedure (plasmapheresis) can be used to treat a crisis. It can also be used to prepare a person for surgery. This procedure produces short-term improvement. Some cases are helped by removing the thymus gland. Steroids are used for short-term benefits. Document Released: 04/17/2000 Document Revised: 04/03/2011 Document Reviewed: 03/12/2013 [employer] Patient  Information 2015 [employer]. This information is not intended to replace advice given to you by your health care provider. Make sure you discuss any questions you have with your health care provider.  

## 2014-06-23 NOTE — Progress Notes (Signed)
Reason for visit: Myasthenia gravis  Colin Reyes is an 74 y.o. male  History of present illness:  Colin Reyes is a 74 year old right-handed white male with history of myasthenia gravis with ocular features. The patient has done fairly well with his myasthenia gravis since last seen. He was having diarrhea previously on the Mestinon, he has eliminated the 180 mg XR tablet, and he remains on the 60 mg IR tablet 3 times daily. He will occasionally have some ptosis of the right eye, without double vision. He denies any loss of vision because of the ptosis. He denies any issues with reading, watching TV, or driving. He denies any issues with chewing or swallowing. He does have some muscular fatigue, but this is not severe. He denies any generalized fatigue. He does not sleep well at night frequently, waking up several times at night. He has chronic left neck and shoulder discomfort, and he takes chronic daily opiate medications for this. He returns for an evaluation.  Past Medical History  Diagnosis Date  . Hypertension   . Diabetes mellitus   . Melanoma 2009    Left chest wall   . Myasthenia gravis 2009  . Obesity   . Dyslipidemia     Past Surgical History  Procedure Laterality Date  . Axillary lymph node dissection    . Umbilical hernia repair  10/27/12  . Basal cell carcinoma excision  11/08/12, 11/29/12    Family History  Problem Relation Age of Onset  . Cancer - Lung Father   . Cancer - Prostate Brother     Social history:  reports that he quit smoking about 24 years ago. He has never used smokeless tobacco. He reports that he does not drink alcohol or use illicit drugs.    Allergies  Allergen Reactions  . Effexor [Venlafaxine Hcl]     Medications:  Prior to Admission medications   Medication Sig Start Date End Date Taking? Authorizing Provider  ADVAIR DISKUS 250-50 MCG/DOSE AEPB Inhale 250 puffs into the lungs 2 (two) times daily. 05/29/12  Yes Historical Provider,  MD  atenolol (TENORMIN) 50 MG tablet Take 50 mg by mouth daily. 06/04/12  Yes Historical Provider, MD  AVODART 0.5 MG capsule Take 0.5 mg by mouth daily. 05/16/12  Yes Historical Provider, MD  Cholecalciferol (VITAMIN D) 2000 UNITS CAPS Take 2,000 Units by mouth daily.   Yes Historical Provider, MD  glipiZIDE (GLUCOTROL XL) 10 MG 24 hr tablet Take 10 mg by mouth 2 (two) times daily. 05/25/12  Yes Historical Provider, MD  hydrochlorothiazide (MICROZIDE) 12.5 MG capsule Take 12.5 mg by mouth daily. 05/25/12  Yes Historical Provider, MD  JENTADUETO 2.05-998 MG TABS Take 2.5 mg by mouth daily. 05/27/13  Yes Historical Provider, MD  LORazepam (ATIVAN) 1 MG tablet Take 1 mg by mouth daily. 04/19/12  Yes Historical Provider, MD  losartan (COZAAR) 50 MG tablet Take 50 mg by mouth daily. 06/03/12  Yes Historical Provider, MD  morphine (MS CONTIN) 15 MG 12 hr tablet Take 15 mg by mouth 2 (two) times daily.  05/06/12  Yes Historical Provider, MD  ONE TOUCH ULTRA TEST test strip  06/12/12  Yes Historical Provider, MD  oxyCODONE-acetaminophen (PERCOCET/ROXICET) 5-325 MG per tablet Take 5-325 tablets by mouth as needed. 04/19/12  Yes Historical Provider, MD  Probiotic Product (PROBIOTIC DAILY PO) Take by mouth daily.   Yes Historical Provider, MD  pyridostigmine (MESTINON) 60 MG tablet TAKE 1 TABLET THREE TIMES A DAY 01/30/14  Yes  Kathrynn Ducking, MD  simvastatin (ZOCOR) 20 MG tablet Take 20 mg by mouth daily. 05/20/12  Yes Historical Provider, MD  terazosin (HYTRIN) 1 MG capsule Take 1 mg by mouth daily. 05/20/12  Yes Historical Provider, MD    ROS:  Out of a complete 14 system review of symptoms, the patient complains only of the following symptoms, and all other reviewed systems are negative.  Neck pain  Blood pressure 145/69, pulse 63, height 5\' 9"  (1.753 m), weight 198 lb 3.2 oz (89.903 kg).  Physical Exam  General: The patient is alert and cooperative at the time of the examination.  Skin: No significant  peripheral edema is noted.   Neurologic Exam  Mental status: The patient is alert and oriented x 3 at the time of the examination. The patient has apparent normal recent and remote memory, with an apparently normal attention span and concentration ability.   Cranial nerves: Facial symmetry is present. Speech is normal, no aphasia or dysarthria is noted. Extraocular movements are full. Visual fields are full. With superior gaze for 1 minute, no subjective double vision, divergence of gaze, or ptosis is seen.  Motor: The patient has good strength in all 4 extremities. With the arms outstretched 1 minute, no fatigable weakness of the deltoid muscles was noted.  Sensory examination: Soft touch sensation is symmetric on the face, arms, and legs.  Coordination: The patient has good finger-nose-finger and heel-to-shin bilaterally.  Gait and station: The patient has a normal gait. Tandem gait is normal. Romberg is negative. No drift is seen.  Reflexes: Deep tendon reflexes are symmetric.   Assessment/Plan:  1. Myasthenia gravis with ocular features  2. Diabetes  3. Chronic left neck pain  The patient is doing quite well with his myasthenia. He is not on any immune suppressing agents currently. His diarrhea has improved off of the 180 mg Mestinon tablets. He does complain of chronic left neck pain, I have offered to send him to physical therapy, he does not wish to go at this time. He will follow-up otherwise in 6 months.  Colin Alexanders MD 06/23/2014 8:15 AM  [facility] [address] [place], [place] [address]  Phone [phone] Fax [phone]

## 2014-06-24 ENCOUNTER — Ambulatory Visit: Payer: Self-pay | Admitting: Neurology

## 2014-11-20 ENCOUNTER — Other Ambulatory Visit: Payer: Self-pay | Admitting: Neurology

## 2014-12-23 ENCOUNTER — Ambulatory Visit (INDEPENDENT_AMBULATORY_CARE_PROVIDER_SITE_OTHER): Payer: PPO | Admitting: Neurology

## 2014-12-23 ENCOUNTER — Encounter: Payer: Self-pay | Admitting: Neurology

## 2014-12-23 VITALS — BP 117/59 | HR 60 | Ht 69.0 in | Wt 191.5 lb

## 2014-12-23 DIAGNOSIS — G7000 Myasthenia gravis without (acute) exacerbation: Secondary | ICD-10-CM

## 2014-12-23 NOTE — Progress Notes (Signed)
Reason for visit: Myasthenia gravis  Colin Reyes is an 74 y.o. male  History of present illness:  Colin Reyes is a 74 year old right-handed white male with a history of myasthenia gravis with primarily ocular features. The patient has been fairly well controlled on oral Mestinon only. He has not required prednisone or any immunosuppressive agents. He denies any significant issues with ptosis or double vision since last seen. He has diabetes, but he has controlled this well, he remains active. He denies any diarrhea on the Mestinon. He denies any significant generalized fatigue. He comes to this office for an evaluation. He continues to complain of some issues with neck and shoulder discomfort associated with cervical spondylosis.  Past Medical History  Diagnosis Date  . Hypertension   . Diabetes mellitus ([place])   . Melanoma ([place]) 2009    Left chest wall   . Myasthenia gravis ([place]) 2009  . Obesity   . Dyslipidemia     Past Surgical History  Procedure Laterality Date  . Axillary lymph node dissection    . Umbilical hernia repair  10/27/12  . Basal cell carcinoma excision  11/08/12, 11/29/12    Family History  Problem Relation Age of Onset  . Cancer - Lung Father   . Cancer - Prostate Brother     Social history:  reports that he quit smoking about 24 years ago. He has never used smokeless tobacco. He reports that he does not drink alcohol or use illicit drugs.    Allergies  Allergen Reactions  . Effexor [Venlafaxine Hcl]     Medications:  Prior to Admission medications   Medication Sig Start Date End Date Taking? Authorizing Provider  ADVAIR DISKUS 250-50 MCG/DOSE AEPB Inhale 250 puffs into the lungs 2 (two) times daily. 05/29/12  Yes Historical Provider, MD  atenolol (TENORMIN) 50 MG tablet Take 50 mg by mouth daily. 06/04/12  Yes Historical Provider, MD  AVODART 0.5 MG capsule Take 0.5 mg by mouth daily. 05/16/12  Yes Historical Provider, MD  Cholecalciferol (VITAMIN D)  2000 UNITS CAPS Take 2,000 Units by mouth daily.   Yes Historical Provider, MD  glipiZIDE (GLUCOTROL XL) 10 MG 24 hr tablet Take 10 mg by mouth 2 (two) times daily. 05/25/12  Yes Historical Provider, MD  hydrochlorothiazide (MICROZIDE) 12.5 MG capsule Take 12.5 mg by mouth daily. 05/25/12  Yes Historical Provider, MD  JENTADUETO 2.05-998 MG TABS Take 2.5 mg by mouth daily. 05/27/13  Yes Historical Provider, MD  LORazepam (ATIVAN) 1 MG tablet Take 1 mg by mouth daily. 04/19/12  Yes Historical Provider, MD  losartan (COZAAR) 50 MG tablet Take 50 mg by mouth 2 (two) times daily.  06/03/12  Yes Historical Provider, MD  morphine (MS CONTIN) 15 MG 12 hr tablet Take 15 mg by mouth 2 (two) times daily.  05/06/12  Yes Historical Provider, MD  ONE TOUCH ULTRA TEST test strip  06/12/12  Yes Historical Provider, MD  oxyCODONE-acetaminophen (PERCOCET/ROXICET) 5-325 MG per tablet Take 5-325 tablets by mouth as needed. 04/19/12  Yes Historical Provider, MD  Probiotic Product (PROBIOTIC DAILY PO) Take by mouth daily.   Yes Historical Provider, MD  pyridostigmine (MESTINON) 60 MG tablet TAKE 1 TABLET THREE TIMES A DAY 11/20/14  Yes Kathrynn Ducking, MD  simvastatin (ZOCOR) 20 MG tablet Take 20 mg by mouth daily. 05/20/12  Yes Historical Provider, MD  terazosin (HYTRIN) 1 MG capsule Take 1 mg by mouth daily. 05/20/12  Yes Historical Provider, MD  ROS:  Out of a complete 14 system review of symptoms, the patient complains only of the following symptoms, and all other reviewed systems are negative.  Leg swelling Back pain, neck discomfort Bruising easily  Blood pressure 117/59, pulse 60, height 5\' 9"  (1.753 m), weight 191 lb 8 oz (86.864 kg).  Physical Exam  General: The patient is alert and cooperative at the time of the examination.  Skin: No significant peripheral edema is noted.   Neurologic Exam  Mental status: The patient is alert and oriented x 3 at the time of the examination. The patient has apparent  normal recent and remote memory, with an apparently normal attention span and concentration ability.   Cranial nerves: Facial symmetry is present. Speech is normal, no aphasia or dysarthria is noted. Extraocular movements are full. Visual fields are full. With superior gaze for 1 minute, no ptosis or divergence of gaze is seen, no subjective double vision was noted.  Motor: The patient has good strength in all 4 extremities. With the arms outstretched for 1 minute, no fatigable weakness of the deltoid muscles was seen.  Sensory examination: Soft touch sensation is symmetric on the face, arms, and legs.  Coordination: The patient has good finger-nose-finger and heel-to-shin bilaterally.  Gait and station: The patient has a normal gait. Tandem gait is slightly unsteady. Romberg is negative. No drift is seen.  Reflexes: Deep tendon reflexes are symmetric, but are depressed.   Assessment/Plan:  1. Myasthenia gravis, ocular features  2. Cervical spondylosis  The patient is doing well on the Mestinon, we will continue the medication for now. He will follow-up in 8 months, sooner if needed.  Jill Alexanders MD 12/24/2014 3:27 PM  [facility] [address] [place], [place] [address]  Phone [phone] Fax [phone]

## 2015-03-01 DIAGNOSIS — M546 Pain in thoracic spine: Secondary | ICD-10-CM | POA: Diagnosis not present

## 2015-03-01 DIAGNOSIS — I10 Essential (primary) hypertension: Secondary | ICD-10-CM | POA: Diagnosis not present

## 2015-03-01 DIAGNOSIS — E1140 Type 2 diabetes mellitus with diabetic neuropathy, unspecified: Secondary | ICD-10-CM | POA: Diagnosis not present

## 2015-03-01 DIAGNOSIS — M79645 Pain in left finger(s): Secondary | ICD-10-CM | POA: Diagnosis not present

## 2015-03-01 DIAGNOSIS — E1142 Type 2 diabetes mellitus with diabetic polyneuropathy: Secondary | ICD-10-CM | POA: Diagnosis not present

## 2015-03-01 DIAGNOSIS — G7000 Myasthenia gravis without (acute) exacerbation: Secondary | ICD-10-CM | POA: Diagnosis not present

## 2015-03-01 DIAGNOSIS — J410 Simple chronic bronchitis: Secondary | ICD-10-CM | POA: Diagnosis not present

## 2015-03-01 DIAGNOSIS — E782 Mixed hyperlipidemia: Secondary | ICD-10-CM | POA: Diagnosis not present

## 2015-04-02 DIAGNOSIS — I10 Essential (primary) hypertension: Secondary | ICD-10-CM | POA: Diagnosis not present

## 2015-04-02 DIAGNOSIS — E1142 Type 2 diabetes mellitus with diabetic polyneuropathy: Secondary | ICD-10-CM | POA: Diagnosis not present

## 2015-04-27 DIAGNOSIS — C44319 Basal cell carcinoma of skin of other parts of face: Secondary | ICD-10-CM | POA: Diagnosis not present

## 2015-04-27 DIAGNOSIS — L570 Actinic keratosis: Secondary | ICD-10-CM | POA: Diagnosis not present

## 2015-04-29 DIAGNOSIS — C439 Malignant melanoma of skin, unspecified: Secondary | ICD-10-CM | POA: Diagnosis not present

## 2015-05-12 DIAGNOSIS — C44319 Basal cell carcinoma of skin of other parts of face: Secondary | ICD-10-CM | POA: Diagnosis not present

## 2015-05-19 ENCOUNTER — Other Ambulatory Visit: Payer: Self-pay | Admitting: Neurology

## 2015-05-31 DIAGNOSIS — M546 Pain in thoracic spine: Secondary | ICD-10-CM | POA: Diagnosis not present

## 2015-05-31 DIAGNOSIS — I10 Essential (primary) hypertension: Secondary | ICD-10-CM | POA: Diagnosis not present

## 2015-05-31 DIAGNOSIS — G7000 Myasthenia gravis without (acute) exacerbation: Secondary | ICD-10-CM | POA: Diagnosis not present

## 2015-05-31 DIAGNOSIS — J410 Simple chronic bronchitis: Secondary | ICD-10-CM | POA: Diagnosis not present

## 2015-05-31 DIAGNOSIS — K5909 Other constipation: Secondary | ICD-10-CM | POA: Diagnosis not present

## 2015-05-31 DIAGNOSIS — E1142 Type 2 diabetes mellitus with diabetic polyneuropathy: Secondary | ICD-10-CM | POA: Diagnosis not present

## 2015-05-31 DIAGNOSIS — E782 Mixed hyperlipidemia: Secondary | ICD-10-CM | POA: Diagnosis not present

## 2015-06-15 DIAGNOSIS — Z1212 Encounter for screening for malignant neoplasm of rectum: Secondary | ICD-10-CM | POA: Diagnosis not present

## 2015-06-15 DIAGNOSIS — Z1211 Encounter for screening for malignant neoplasm of colon: Secondary | ICD-10-CM | POA: Diagnosis not present

## 2015-08-17 DIAGNOSIS — C44319 Basal cell carcinoma of skin of other parts of face: Secondary | ICD-10-CM | POA: Diagnosis not present

## 2015-08-23 ENCOUNTER — Ambulatory Visit (INDEPENDENT_AMBULATORY_CARE_PROVIDER_SITE_OTHER): Payer: PPO | Admitting: Neurology

## 2015-08-23 ENCOUNTER — Encounter: Payer: Self-pay | Admitting: Neurology

## 2015-08-23 VITALS — BP 151/63 | HR 62 | Ht 69.0 in | Wt 191.5 lb

## 2015-08-23 DIAGNOSIS — G7000 Myasthenia gravis without (acute) exacerbation: Secondary | ICD-10-CM

## 2015-08-23 MED ORDER — PYRIDOSTIGMINE BROMIDE 60 MG PO TABS: 60.0000 mg | ORAL_TABLET | Freq: Three times a day (TID) | ORAL | 3 refills | Status: DC

## 2015-08-23 NOTE — Progress Notes (Signed)
Reason for visit: Myasthenia gravis  Colin Reyes is an 75 y.o. male  History of present illness:  Mr. Colin Reyes is a 75 year old right-handed white male with a history of myasthenia gravis primarily with ocular features. The patient has diabetes, he claims that his hemoglobin A1c is less than 7 at this time. The patient has done well since last seen, he has not had any significant issues with double vision, he occasionally may have some ptosis. He denies issues with speech or swallowing or chewing. He has had in the past difficulty with breathing following a surgical procedure. He denies any significant issues with fatigue. He recently has had some skin cancers, basal cell carcinoma, resected from his head and neck. He may require cataract surgery in the future. He returns to this office for an evaluation.  Past Medical History:  Diagnosis Date  . Diabetes mellitus ([place])   . Dyslipidemia   . Hypertension   . Melanoma ([place]) 2009   Left chest wall   . Myasthenia gravis ([place]) 2009  . Obesity     Past Surgical History:  Procedure Laterality Date  . AXILLARY LYMPH NODE DISSECTION    . BASAL CELL CARCINOMA EXCISION  11/08/12, 11/29/12  . UMBILICAL HERNIA REPAIR  10/27/12    Family History  Problem Relation Age of Onset  . Cancer - Lung Father   . Cancer - Prostate Brother     Social history:  reports that he quit smoking about 25 years ago. He has never used smokeless tobacco. He reports that he does not drink alcohol or use drugs.    Allergies  Allergen Reactions  . Effexor [Venlafaxine Hcl]     Medications:  Prior to Admission medications   Medication Sig Start Date End Date Taking? Authorizing Provider  ADVAIR DISKUS 250-50 MCG/DOSE AEPB Inhale 250 puffs into the lungs 2 (two) times daily. 05/29/12   Historical Provider, MD  atenolol (TENORMIN) 50 MG tablet Take 50 mg by mouth daily. 06/04/12   Historical Provider, MD  AVODART 0.5 MG capsule Take 0.5 mg by mouth daily.  05/16/12   Historical Provider, MD  Cholecalciferol (VITAMIN D) 2000 UNITS CAPS Take 2,000 Units by mouth daily.    Historical Provider, MD  glipiZIDE (GLUCOTROL XL) 10 MG 24 hr tablet Take 10 mg by mouth 2 (two) times daily. 05/25/12   Historical Provider, MD  hydrochlorothiazide (MICROZIDE) 12.5 MG capsule Take 12.5 mg by mouth daily. 05/25/12   Historical Provider, MD  JENTADUETO 2.05-998 MG TABS Take 2.5 mg by mouth daily. 05/27/13   Historical Provider, MD  LORazepam (ATIVAN) 1 MG tablet Take 1 mg by mouth daily. 04/19/12   Historical Provider, MD  losartan (COZAAR) 50 MG tablet Take 50 mg by mouth 2 (two) times daily.  06/03/12   Historical Provider, MD  morphine (MS CONTIN) 15 MG 12 hr tablet Take 15 mg by mouth 2 (two) times daily.  05/06/12   Historical Provider, MD  ONE TOUCH ULTRA TEST test strip  06/12/12   Historical Provider, MD  oxyCODONE-acetaminophen (PERCOCET/ROXICET) 5-325 MG per tablet Take 5-325 tablets by mouth as needed. 04/19/12   Historical Provider, MD  Probiotic Product (PROBIOTIC DAILY PO) Take by mouth daily.    Historical Provider, MD  pyridostigmine (MESTINON) 60 MG tablet TAKE 1 TABLET THREE TIMES A DAY 05/19/15   Kathrynn Ducking, MD  simvastatin (ZOCOR) 20 MG tablet Take 20 mg by mouth daily. 05/20/12   Historical Provider, MD  terazosin (HYTRIN)  1 MG capsule Take 1 mg by mouth daily. 05/20/12   Historical Provider, MD    ROS:  Out of a complete 14 system review of symptoms, the patient complains only of the following symptoms, and all other reviewed systems are negative.  Leg swelling Constipation Bruising easily  Blood pressure (!) 151/63, pulse 62, height 5\' 9"  (1.753 m), weight 191 lb 8 oz (86.9 kg).  Physical Exam  General: The patient is alert and cooperative at the time of the examination.  Skin: No significant peripheral edema is noted.   Neurologic Exam  Mental status: The patient is alert and oriented x 3 at the time of the examination. The patient has  apparent normal recent and remote memory, with an apparently normal attention span and concentration ability.   Cranial nerves: Facial symmetry is present. Speech is normal, no aphasia or dysarthria is noted. Extraocular movements are full. Visual fields are full. With superior gaze for 1 minute, no divergence of gaze or ptosis is seen, no subjective double vision is noted.  Motor: The patient has good strength in all 4 extremities. With the arms outstretched 1 minute, no fatigable weakness of the deltoid muscles is noted.  Sensory examination: Soft touch sensation is symmetric on the face, arms, and legs.  Coordination: The patient has good finger-nose-finger and heel-to-shin bilaterally.  Gait and station: The patient has a normal gait. Tandem gait is normal. Romberg is negative. No drift is seen.  Reflexes: Deep tendon reflexes are symmetric.   Assessment/Plan:  1. Myasthenia gravis, ocular features  The patient is doing quite well with his myasthenia at this time. We will continue the Mestinon taking 60 mg 3 times daily. He is tolerating the medication well. He will follow-up in one year, sooner if needed. A prescription was written for the Mestinon.  Jill Alexanders MD 08/23/2015 8:23 AM  [facility] [address] [place], [place] [address]  Phone [phone] Fax [phone]

## 2015-09-02 DIAGNOSIS — E782 Mixed hyperlipidemia: Secondary | ICD-10-CM | POA: Diagnosis not present

## 2015-09-02 DIAGNOSIS — M546 Pain in thoracic spine: Secondary | ICD-10-CM | POA: Diagnosis not present

## 2015-09-02 DIAGNOSIS — G7000 Myasthenia gravis without (acute) exacerbation: Secondary | ICD-10-CM | POA: Diagnosis not present

## 2015-09-02 DIAGNOSIS — J410 Simple chronic bronchitis: Secondary | ICD-10-CM | POA: Diagnosis not present

## 2015-09-02 DIAGNOSIS — E1142 Type 2 diabetes mellitus with diabetic polyneuropathy: Secondary | ICD-10-CM | POA: Diagnosis not present

## 2015-09-02 DIAGNOSIS — I10 Essential (primary) hypertension: Secondary | ICD-10-CM | POA: Diagnosis not present

## 2015-09-07 DIAGNOSIS — H25813 Combined forms of age-related cataract, bilateral: Secondary | ICD-10-CM | POA: Diagnosis not present

## 2015-09-07 DIAGNOSIS — E119 Type 2 diabetes mellitus without complications: Secondary | ICD-10-CM | POA: Diagnosis not present

## 2015-09-22 DIAGNOSIS — C44319 Basal cell carcinoma of skin of other parts of face: Secondary | ICD-10-CM | POA: Diagnosis not present

## 2015-10-12 DIAGNOSIS — K4090 Unilateral inguinal hernia, without obstruction or gangrene, not specified as recurrent: Secondary | ICD-10-CM | POA: Diagnosis not present

## 2015-10-12 DIAGNOSIS — Z23 Encounter for immunization: Secondary | ICD-10-CM | POA: Diagnosis not present

## 2015-10-21 DIAGNOSIS — C44319 Basal cell carcinoma of skin of other parts of face: Secondary | ICD-10-CM | POA: Diagnosis not present

## 2015-10-21 DIAGNOSIS — L570 Actinic keratosis: Secondary | ICD-10-CM | POA: Diagnosis not present

## 2015-10-26 DIAGNOSIS — G7000 Myasthenia gravis without (acute) exacerbation: Secondary | ICD-10-CM | POA: Diagnosis not present

## 2015-10-26 DIAGNOSIS — I10 Essential (primary) hypertension: Secondary | ICD-10-CM | POA: Diagnosis not present

## 2015-10-26 DIAGNOSIS — E119 Type 2 diabetes mellitus without complications: Secondary | ICD-10-CM | POA: Diagnosis not present

## 2015-10-26 DIAGNOSIS — K4090 Unilateral inguinal hernia, without obstruction or gangrene, not specified as recurrent: Secondary | ICD-10-CM | POA: Diagnosis not present

## 2015-10-28 DIAGNOSIS — Z01818 Encounter for other preprocedural examination: Secondary | ICD-10-CM | POA: Diagnosis not present

## 2015-11-01 DIAGNOSIS — Z7984 Long term (current) use of oral hypoglycemic drugs: Secondary | ICD-10-CM | POA: Diagnosis not present

## 2015-11-01 DIAGNOSIS — Z79899 Other long term (current) drug therapy: Secondary | ICD-10-CM | POA: Diagnosis not present

## 2015-11-01 DIAGNOSIS — Z87891 Personal history of nicotine dependence: Secondary | ICD-10-CM | POA: Diagnosis not present

## 2015-11-01 DIAGNOSIS — J449 Chronic obstructive pulmonary disease, unspecified: Secondary | ICD-10-CM | POA: Diagnosis not present

## 2015-11-01 DIAGNOSIS — E785 Hyperlipidemia, unspecified: Secondary | ICD-10-CM | POA: Diagnosis not present

## 2015-11-01 DIAGNOSIS — K4090 Unilateral inguinal hernia, without obstruction or gangrene, not specified as recurrent: Secondary | ICD-10-CM | POA: Diagnosis not present

## 2015-11-01 DIAGNOSIS — Z7982 Long term (current) use of aspirin: Secondary | ICD-10-CM | POA: Diagnosis not present

## 2015-11-01 DIAGNOSIS — I10 Essential (primary) hypertension: Secondary | ICD-10-CM | POA: Diagnosis not present

## 2015-11-01 DIAGNOSIS — G7000 Myasthenia gravis without (acute) exacerbation: Secondary | ICD-10-CM | POA: Diagnosis not present

## 2015-11-01 DIAGNOSIS — E119 Type 2 diabetes mellitus without complications: Secondary | ICD-10-CM | POA: Diagnosis not present

## 2015-11-24 DIAGNOSIS — C44319 Basal cell carcinoma of skin of other parts of face: Secondary | ICD-10-CM | POA: Diagnosis not present

## 2015-12-07 DIAGNOSIS — I10 Essential (primary) hypertension: Secondary | ICD-10-CM | POA: Diagnosis not present

## 2015-12-07 DIAGNOSIS — R600 Localized edema: Secondary | ICD-10-CM | POA: Diagnosis not present

## 2015-12-07 DIAGNOSIS — E782 Mixed hyperlipidemia: Secondary | ICD-10-CM | POA: Diagnosis not present

## 2015-12-07 DIAGNOSIS — E1142 Type 2 diabetes mellitus with diabetic polyneuropathy: Secondary | ICD-10-CM | POA: Diagnosis not present

## 2015-12-07 DIAGNOSIS — M546 Pain in thoracic spine: Secondary | ICD-10-CM | POA: Diagnosis not present

## 2015-12-07 DIAGNOSIS — J410 Simple chronic bronchitis: Secondary | ICD-10-CM | POA: Diagnosis not present

## 2015-12-07 DIAGNOSIS — G7000 Myasthenia gravis without (acute) exacerbation: Secondary | ICD-10-CM | POA: Diagnosis not present

## 2015-12-07 DIAGNOSIS — E1165 Type 2 diabetes mellitus with hyperglycemia: Secondary | ICD-10-CM | POA: Diagnosis not present

## 2015-12-21 DIAGNOSIS — R600 Localized edema: Secondary | ICD-10-CM | POA: Diagnosis not present

## 2016-01-10 DIAGNOSIS — E663 Overweight: Secondary | ICD-10-CM | POA: Diagnosis not present

## 2016-01-10 DIAGNOSIS — I888 Other nonspecific lymphadenitis: Secondary | ICD-10-CM | POA: Diagnosis not present

## 2016-01-10 DIAGNOSIS — Z6829 Body mass index (BMI) 29.0-29.9, adult: Secondary | ICD-10-CM | POA: Diagnosis not present

## 2016-01-10 DIAGNOSIS — Z0001 Encounter for general adult medical examination with abnormal findings: Secondary | ICD-10-CM | POA: Diagnosis not present

## 2016-01-10 DIAGNOSIS — Z125 Encounter for screening for malignant neoplasm of prostate: Secondary | ICD-10-CM | POA: Diagnosis not present

## 2016-03-13 DIAGNOSIS — I10 Essential (primary) hypertension: Secondary | ICD-10-CM | POA: Diagnosis not present

## 2016-03-13 DIAGNOSIS — D649 Anemia, unspecified: Secondary | ICD-10-CM | POA: Diagnosis not present

## 2016-03-13 DIAGNOSIS — E1142 Type 2 diabetes mellitus with diabetic polyneuropathy: Secondary | ICD-10-CM | POA: Diagnosis not present

## 2016-03-13 DIAGNOSIS — E782 Mixed hyperlipidemia: Secondary | ICD-10-CM | POA: Diagnosis not present

## 2016-03-14 DIAGNOSIS — D649 Anemia, unspecified: Secondary | ICD-10-CM | POA: Diagnosis not present

## 2016-03-15 DIAGNOSIS — Z85820 Personal history of malignant melanoma of skin: Secondary | ICD-10-CM | POA: Diagnosis not present

## 2016-03-15 DIAGNOSIS — E782 Mixed hyperlipidemia: Secondary | ICD-10-CM | POA: Diagnosis not present

## 2016-03-15 DIAGNOSIS — I10 Essential (primary) hypertension: Secondary | ICD-10-CM | POA: Diagnosis not present

## 2016-03-15 DIAGNOSIS — C784 Secondary malignant neoplasm of small intestine: Secondary | ICD-10-CM | POA: Diagnosis not present

## 2016-03-15 DIAGNOSIS — K2970 Gastritis, unspecified, without bleeding: Secondary | ICD-10-CM | POA: Diagnosis not present

## 2016-03-15 DIAGNOSIS — J449 Chronic obstructive pulmonary disease, unspecified: Secondary | ICD-10-CM | POA: Diagnosis not present

## 2016-03-15 DIAGNOSIS — D62 Acute posthemorrhagic anemia: Secondary | ICD-10-CM | POA: Diagnosis not present

## 2016-03-15 DIAGNOSIS — G629 Polyneuropathy, unspecified: Secondary | ICD-10-CM | POA: Diagnosis not present

## 2016-03-15 DIAGNOSIS — E1165 Type 2 diabetes mellitus with hyperglycemia: Secondary | ICD-10-CM | POA: Diagnosis not present

## 2016-03-15 DIAGNOSIS — E1151 Type 2 diabetes mellitus with diabetic peripheral angiopathy without gangrene: Secondary | ICD-10-CM | POA: Diagnosis not present

## 2016-03-15 DIAGNOSIS — E785 Hyperlipidemia, unspecified: Secondary | ICD-10-CM | POA: Diagnosis not present

## 2016-03-15 DIAGNOSIS — K921 Melena: Secondary | ICD-10-CM | POA: Diagnosis not present

## 2016-03-15 DIAGNOSIS — E1142 Type 2 diabetes mellitus with diabetic polyneuropathy: Secondary | ICD-10-CM | POA: Diagnosis not present

## 2016-03-15 DIAGNOSIS — D500 Iron deficiency anemia secondary to blood loss (chronic): Secondary | ICD-10-CM | POA: Diagnosis not present

## 2016-03-15 DIAGNOSIS — D649 Anemia, unspecified: Secondary | ICD-10-CM | POA: Diagnosis not present

## 2016-03-15 DIAGNOSIS — G7000 Myasthenia gravis without (acute) exacerbation: Secondary | ICD-10-CM | POA: Diagnosis not present

## 2016-03-15 DIAGNOSIS — K922 Gastrointestinal hemorrhage, unspecified: Secondary | ICD-10-CM | POA: Diagnosis not present

## 2016-03-15 DIAGNOSIS — R031 Nonspecific low blood-pressure reading: Secondary | ICD-10-CM | POA: Diagnosis not present

## 2016-03-15 DIAGNOSIS — Z7984 Long term (current) use of oral hypoglycemic drugs: Secondary | ICD-10-CM | POA: Diagnosis not present

## 2016-03-16 DIAGNOSIS — R109 Unspecified abdominal pain: Secondary | ICD-10-CM | POA: Diagnosis not present

## 2016-03-16 DIAGNOSIS — E1151 Type 2 diabetes mellitus with diabetic peripheral angiopathy without gangrene: Secondary | ICD-10-CM | POA: Diagnosis not present

## 2016-03-16 DIAGNOSIS — K922 Gastrointestinal hemorrhage, unspecified: Secondary | ICD-10-CM | POA: Diagnosis not present

## 2016-03-17 DIAGNOSIS — Z85820 Personal history of malignant melanoma of skin: Secondary | ICD-10-CM | POA: Diagnosis not present

## 2016-03-17 DIAGNOSIS — K6389 Other specified diseases of intestine: Secondary | ICD-10-CM | POA: Diagnosis not present

## 2016-03-17 DIAGNOSIS — C7989 Secondary malignant neoplasm of other specified sites: Secondary | ICD-10-CM | POA: Diagnosis not present

## 2016-03-17 DIAGNOSIS — K922 Gastrointestinal hemorrhage, unspecified: Secondary | ICD-10-CM | POA: Diagnosis not present

## 2016-03-17 DIAGNOSIS — K2970 Gastritis, unspecified, without bleeding: Secondary | ICD-10-CM | POA: Diagnosis not present

## 2016-03-23 DIAGNOSIS — C439 Malignant melanoma of skin, unspecified: Secondary | ICD-10-CM | POA: Diagnosis not present

## 2016-03-28 DIAGNOSIS — D500 Iron deficiency anemia secondary to blood loss (chronic): Secondary | ICD-10-CM | POA: Diagnosis not present

## 2016-03-28 DIAGNOSIS — I10 Essential (primary) hypertension: Secondary | ICD-10-CM | POA: Diagnosis not present

## 2016-03-28 DIAGNOSIS — C784 Secondary malignant neoplasm of small intestine: Secondary | ICD-10-CM | POA: Diagnosis not present

## 2016-03-28 DIAGNOSIS — R0789 Other chest pain: Secondary | ICD-10-CM | POA: Diagnosis not present

## 2016-03-28 DIAGNOSIS — C439 Malignant melanoma of skin, unspecified: Secondary | ICD-10-CM | POA: Diagnosis not present

## 2016-03-28 DIAGNOSIS — C269 Malignant neoplasm of ill-defined sites within the digestive system: Secondary | ICD-10-CM | POA: Diagnosis not present

## 2016-03-30 DIAGNOSIS — Z87891 Personal history of nicotine dependence: Secondary | ICD-10-CM | POA: Diagnosis not present

## 2016-03-30 DIAGNOSIS — I700 Atherosclerosis of aorta: Secondary | ICD-10-CM | POA: Diagnosis not present

## 2016-03-30 DIAGNOSIS — Z801 Family history of malignant neoplasm of trachea, bronchus and lung: Secondary | ICD-10-CM | POA: Diagnosis not present

## 2016-03-30 DIAGNOSIS — C799 Secondary malignant neoplasm of unspecified site: Secondary | ICD-10-CM | POA: Diagnosis not present

## 2016-03-30 DIAGNOSIS — C784 Secondary malignant neoplasm of small intestine: Secondary | ICD-10-CM | POA: Diagnosis not present

## 2016-03-30 DIAGNOSIS — R42 Dizziness and giddiness: Secondary | ICD-10-CM | POA: Diagnosis not present

## 2016-03-30 DIAGNOSIS — R109 Unspecified abdominal pain: Secondary | ICD-10-CM | POA: Diagnosis not present

## 2016-03-30 DIAGNOSIS — J984 Other disorders of lung: Secondary | ICD-10-CM | POA: Diagnosis not present

## 2016-03-30 DIAGNOSIS — C439 Malignant melanoma of skin, unspecified: Secondary | ICD-10-CM | POA: Diagnosis not present

## 2016-03-30 DIAGNOSIS — E119 Type 2 diabetes mellitus without complications: Secondary | ICD-10-CM | POA: Diagnosis not present

## 2016-03-30 DIAGNOSIS — G7000 Myasthenia gravis without (acute) exacerbation: Secondary | ICD-10-CM | POA: Diagnosis not present

## 2016-03-30 DIAGNOSIS — D649 Anemia, unspecified: Secondary | ICD-10-CM | POA: Insufficient documentation

## 2016-03-30 DIAGNOSIS — Z8249 Family history of ischemic heart disease and other diseases of the circulatory system: Secondary | ICD-10-CM | POA: Diagnosis not present

## 2016-03-30 DIAGNOSIS — D369 Benign neoplasm, unspecified site: Secondary | ICD-10-CM | POA: Diagnosis not present

## 2016-03-30 DIAGNOSIS — I10 Essential (primary) hypertension: Secondary | ICD-10-CM | POA: Diagnosis not present

## 2016-03-30 HISTORY — DX: Anemia, unspecified: D64.9

## 2016-04-01 DIAGNOSIS — R42 Dizziness and giddiness: Secondary | ICD-10-CM | POA: Diagnosis not present

## 2016-04-01 DIAGNOSIS — C439 Malignant melanoma of skin, unspecified: Secondary | ICD-10-CM | POA: Diagnosis not present

## 2016-04-05 ENCOUNTER — Telehealth: Payer: Self-pay | Admitting: Neurology

## 2016-04-05 NOTE — Telephone Encounter (Signed)
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  I would not recommend premedication with plasmapheresis, the patient is not on any steroids or any immunosuppressive agents currently.  I see no contraindication to having surgery.

## 2016-04-07 DIAGNOSIS — G7000 Myasthenia gravis without (acute) exacerbation: Secondary | ICD-10-CM | POA: Diagnosis not present

## 2016-04-09 DIAGNOSIS — Z800 Family history of malignant neoplasm of digestive organs: Secondary | ICD-10-CM | POA: Diagnosis not present

## 2016-04-09 DIAGNOSIS — Z8249 Family history of ischemic heart disease and other diseases of the circulatory system: Secondary | ICD-10-CM | POA: Diagnosis not present

## 2016-04-09 DIAGNOSIS — Z01810 Encounter for preprocedural cardiovascular examination: Secondary | ICD-10-CM | POA: Diagnosis not present

## 2016-04-09 DIAGNOSIS — I700 Atherosclerosis of aorta: Secondary | ICD-10-CM | POA: Diagnosis not present

## 2016-04-09 DIAGNOSIS — C439 Malignant melanoma of skin, unspecified: Secondary | ICD-10-CM | POA: Diagnosis not present

## 2016-04-09 DIAGNOSIS — K567 Ileus, unspecified: Secondary | ICD-10-CM | POA: Diagnosis not present

## 2016-04-09 DIAGNOSIS — C784 Secondary malignant neoplasm of small intestine: Secondary | ICD-10-CM | POA: Diagnosis not present

## 2016-04-09 DIAGNOSIS — G7000 Myasthenia gravis without (acute) exacerbation: Secondary | ICD-10-CM | POA: Diagnosis not present

## 2016-04-09 DIAGNOSIS — C799 Secondary malignant neoplasm of unspecified site: Secondary | ICD-10-CM | POA: Diagnosis not present

## 2016-04-09 DIAGNOSIS — J449 Chronic obstructive pulmonary disease, unspecified: Secondary | ICD-10-CM | POA: Diagnosis not present

## 2016-04-09 DIAGNOSIS — I10 Essential (primary) hypertension: Secondary | ICD-10-CM | POA: Diagnosis not present

## 2016-04-09 DIAGNOSIS — R0600 Dyspnea, unspecified: Secondary | ICD-10-CM | POA: Diagnosis not present

## 2016-04-09 DIAGNOSIS — R0681 Apnea, not elsewhere classified: Secondary | ICD-10-CM | POA: Diagnosis not present

## 2016-04-09 DIAGNOSIS — Z7982 Long term (current) use of aspirin: Secondary | ICD-10-CM | POA: Diagnosis not present

## 2016-04-09 DIAGNOSIS — Z7984 Long term (current) use of oral hypoglycemic drugs: Secondary | ICD-10-CM | POA: Diagnosis not present

## 2016-04-09 DIAGNOSIS — Z801 Family history of malignant neoplasm of trachea, bronchus and lung: Secondary | ICD-10-CM | POA: Diagnosis not present

## 2016-04-09 DIAGNOSIS — E119 Type 2 diabetes mellitus without complications: Secondary | ICD-10-CM | POA: Diagnosis not present

## 2016-04-09 DIAGNOSIS — Z87891 Personal history of nicotine dependence: Secondary | ICD-10-CM | POA: Diagnosis not present

## 2016-04-09 DIAGNOSIS — Z7951 Long term (current) use of inhaled steroids: Secondary | ICD-10-CM | POA: Diagnosis not present

## 2016-04-10 DIAGNOSIS — C439 Malignant melanoma of skin, unspecified: Secondary | ICD-10-CM | POA: Diagnosis not present

## 2016-04-10 DIAGNOSIS — C784 Secondary malignant neoplasm of small intestine: Secondary | ICD-10-CM | POA: Diagnosis not present

## 2016-04-10 DIAGNOSIS — C179 Malignant neoplasm of small intestine, unspecified: Secondary | ICD-10-CM | POA: Diagnosis not present

## 2016-04-13 DIAGNOSIS — Z4682 Encounter for fitting and adjustment of non-vascular catheter: Secondary | ICD-10-CM | POA: Diagnosis not present

## 2016-04-13 DIAGNOSIS — J9811 Atelectasis: Secondary | ICD-10-CM | POA: Diagnosis not present

## 2016-04-13 DIAGNOSIS — J90 Pleural effusion, not elsewhere classified: Secondary | ICD-10-CM | POA: Diagnosis not present

## 2016-05-03 DIAGNOSIS — Z09 Encounter for follow-up examination after completed treatment for conditions other than malignant neoplasm: Secondary | ICD-10-CM | POA: Diagnosis not present

## 2016-05-03 DIAGNOSIS — C799 Secondary malignant neoplasm of unspecified site: Secondary | ICD-10-CM | POA: Diagnosis not present

## 2016-05-09 DIAGNOSIS — E119 Type 2 diabetes mellitus without complications: Secondary | ICD-10-CM | POA: Diagnosis not present

## 2016-05-09 DIAGNOSIS — G7000 Myasthenia gravis without (acute) exacerbation: Secondary | ICD-10-CM | POA: Diagnosis not present

## 2016-05-09 DIAGNOSIS — C799 Secondary malignant neoplasm of unspecified site: Secondary | ICD-10-CM | POA: Diagnosis not present

## 2016-05-18 DIAGNOSIS — L570 Actinic keratosis: Secondary | ICD-10-CM | POA: Diagnosis not present

## 2016-05-18 DIAGNOSIS — D485 Neoplasm of uncertain behavior of skin: Secondary | ICD-10-CM | POA: Diagnosis not present

## 2016-05-23 DIAGNOSIS — C44519 Basal cell carcinoma of skin of other part of trunk: Secondary | ICD-10-CM | POA: Diagnosis not present

## 2016-06-16 DIAGNOSIS — R935 Abnormal findings on diagnostic imaging of other abdominal regions, including retroperitoneum: Secondary | ICD-10-CM | POA: Diagnosis not present

## 2016-06-16 DIAGNOSIS — C439 Malignant melanoma of skin, unspecified: Secondary | ICD-10-CM | POA: Diagnosis not present

## 2016-06-16 DIAGNOSIS — C7989 Secondary malignant neoplasm of other specified sites: Secondary | ICD-10-CM | POA: Diagnosis not present

## 2016-06-16 DIAGNOSIS — C786 Secondary malignant neoplasm of retroperitoneum and peritoneum: Secondary | ICD-10-CM | POA: Diagnosis not present

## 2016-06-20 ENCOUNTER — Telehealth: Payer: Self-pay | Admitting: Neurology

## 2016-06-20 NOTE — Telephone Encounter (Signed)
Dr Jannifer Franklin- please advise. Pt last saw you 08/23/15. You have seen him for MG. He has an upcoming appt 08/24/16

## 2016-06-20 NOTE — Telephone Encounter (Signed)
I called patient. The patient has had recurrence of his melanoma. The patient may be entering into chemotherapy within the next several weeks. His oncologist is concerned that this may worsen his myasthenic symptoms.  I'm not sure yet what agent will be used for chemotherapy. The patient has ocular myasthenia gravis, he has been easily controlled with only using Mestinon.  If need be, prednisone can be added in the future, but I do not foresee that this patient will be at risk for a life-threatening myasthenia gravis exacerbation.

## 2016-06-20 NOTE — Telephone Encounter (Signed)
Patient is calling to advise his melanoma has returned and wants to talk to Dr. Jannifer Franklin regarding the treatment.

## 2016-06-21 DIAGNOSIS — Z79899 Other long term (current) drug therapy: Secondary | ICD-10-CM

## 2016-06-21 DIAGNOSIS — Z658 Other specified problems related to psychosocial circumstances: Secondary | ICD-10-CM

## 2016-06-21 HISTORY — DX: Other long term (current) drug therapy: Z79.899

## 2016-06-21 HISTORY — DX: Other specified problems related to psychosocial circumstances: Z65.8

## 2016-06-22 DIAGNOSIS — C439 Malignant melanoma of skin, unspecified: Secondary | ICD-10-CM | POA: Diagnosis not present

## 2016-06-22 DIAGNOSIS — Z7951 Long term (current) use of inhaled steroids: Secondary | ICD-10-CM | POA: Diagnosis not present

## 2016-06-22 DIAGNOSIS — I6521 Occlusion and stenosis of right carotid artery: Secondary | ICD-10-CM | POA: Diagnosis not present

## 2016-06-22 DIAGNOSIS — C7989 Secondary malignant neoplasm of other specified sites: Secondary | ICD-10-CM | POA: Diagnosis not present

## 2016-06-22 DIAGNOSIS — Z9889 Other specified postprocedural states: Secondary | ICD-10-CM | POA: Diagnosis not present

## 2016-06-22 DIAGNOSIS — G7000 Myasthenia gravis without (acute) exacerbation: Secondary | ICD-10-CM | POA: Diagnosis not present

## 2016-06-22 DIAGNOSIS — J449 Chronic obstructive pulmonary disease, unspecified: Secondary | ICD-10-CM | POA: Diagnosis not present

## 2016-06-22 DIAGNOSIS — C799 Secondary malignant neoplasm of unspecified site: Secondary | ICD-10-CM | POA: Diagnosis not present

## 2016-06-22 DIAGNOSIS — R9089 Other abnormal findings on diagnostic imaging of central nervous system: Secondary | ICD-10-CM | POA: Diagnosis not present

## 2016-06-22 DIAGNOSIS — E119 Type 2 diabetes mellitus without complications: Secondary | ICD-10-CM | POA: Diagnosis not present

## 2016-06-22 DIAGNOSIS — Z8249 Family history of ischemic heart disease and other diseases of the circulatory system: Secondary | ICD-10-CM | POA: Diagnosis not present

## 2016-06-22 DIAGNOSIS — C786 Secondary malignant neoplasm of retroperitoneum and peritoneum: Secondary | ICD-10-CM | POA: Diagnosis not present

## 2016-06-22 DIAGNOSIS — I10 Essential (primary) hypertension: Secondary | ICD-10-CM | POA: Diagnosis not present

## 2016-06-22 DIAGNOSIS — Z79891 Long term (current) use of opiate analgesic: Secondary | ICD-10-CM | POA: Diagnosis not present

## 2016-06-22 DIAGNOSIS — Z79899 Other long term (current) drug therapy: Secondary | ICD-10-CM | POA: Diagnosis not present

## 2016-06-22 DIAGNOSIS — C784 Secondary malignant neoplasm of small intestine: Secondary | ICD-10-CM | POA: Diagnosis not present

## 2016-06-22 DIAGNOSIS — K55029 Acute infarction of small intestine, extent unspecified: Secondary | ICD-10-CM | POA: Diagnosis not present

## 2016-06-22 DIAGNOSIS — Z658 Other specified problems related to psychosocial circumstances: Secondary | ICD-10-CM | POA: Diagnosis not present

## 2016-06-22 DIAGNOSIS — R9082 White matter disease, unspecified: Secondary | ICD-10-CM | POA: Diagnosis not present

## 2016-06-22 DIAGNOSIS — G893 Neoplasm related pain (acute) (chronic): Secondary | ICD-10-CM | POA: Diagnosis not present

## 2016-06-30 DIAGNOSIS — I10 Essential (primary) hypertension: Secondary | ICD-10-CM | POA: Diagnosis not present

## 2016-06-30 DIAGNOSIS — E1121 Type 2 diabetes mellitus with diabetic nephropathy: Secondary | ICD-10-CM | POA: Diagnosis not present

## 2016-06-30 DIAGNOSIS — E782 Mixed hyperlipidemia: Secondary | ICD-10-CM | POA: Diagnosis not present

## 2016-06-30 DIAGNOSIS — G7000 Myasthenia gravis without (acute) exacerbation: Secondary | ICD-10-CM | POA: Diagnosis not present

## 2016-06-30 DIAGNOSIS — C4359 Malignant melanoma of other part of trunk: Secondary | ICD-10-CM | POA: Diagnosis not present

## 2016-06-30 DIAGNOSIS — C784 Secondary malignant neoplasm of small intestine: Secondary | ICD-10-CM | POA: Diagnosis not present

## 2016-06-30 DIAGNOSIS — E1142 Type 2 diabetes mellitus with diabetic polyneuropathy: Secondary | ICD-10-CM | POA: Diagnosis not present

## 2016-07-11 DIAGNOSIS — Z7984 Long term (current) use of oral hypoglycemic drugs: Secondary | ICD-10-CM | POA: Diagnosis not present

## 2016-07-11 DIAGNOSIS — M79642 Pain in left hand: Secondary | ICD-10-CM | POA: Diagnosis not present

## 2016-07-11 DIAGNOSIS — Z8249 Family history of ischemic heart disease and other diseases of the circulatory system: Secondary | ICD-10-CM | POA: Diagnosis not present

## 2016-07-11 DIAGNOSIS — R5383 Other fatigue: Secondary | ICD-10-CM | POA: Diagnosis not present

## 2016-07-11 DIAGNOSIS — E119 Type 2 diabetes mellitus without complications: Secondary | ICD-10-CM | POA: Diagnosis not present

## 2016-07-11 DIAGNOSIS — R202 Paresthesia of skin: Secondary | ICD-10-CM | POA: Diagnosis not present

## 2016-07-11 DIAGNOSIS — J449 Chronic obstructive pulmonary disease, unspecified: Secondary | ICD-10-CM | POA: Diagnosis not present

## 2016-07-11 DIAGNOSIS — G893 Neoplasm related pain (acute) (chronic): Secondary | ICD-10-CM | POA: Insufficient documentation

## 2016-07-11 DIAGNOSIS — I10 Essential (primary) hypertension: Secondary | ICD-10-CM | POA: Diagnosis not present

## 2016-07-11 DIAGNOSIS — Z7951 Long term (current) use of inhaled steroids: Secondary | ICD-10-CM | POA: Diagnosis not present

## 2016-07-11 DIAGNOSIS — R1909 Other intra-abdominal and pelvic swelling, mass and lump: Secondary | ICD-10-CM | POA: Diagnosis not present

## 2016-07-11 DIAGNOSIS — C7989 Secondary malignant neoplasm of other specified sites: Secondary | ICD-10-CM | POA: Diagnosis not present

## 2016-07-11 DIAGNOSIS — D649 Anemia, unspecified: Secondary | ICD-10-CM | POA: Diagnosis not present

## 2016-07-11 DIAGNOSIS — G7000 Myasthenia gravis without (acute) exacerbation: Secondary | ICD-10-CM | POA: Diagnosis not present

## 2016-07-11 DIAGNOSIS — C439 Malignant melanoma of skin, unspecified: Secondary | ICD-10-CM | POA: Diagnosis not present

## 2016-07-11 DIAGNOSIS — M79641 Pain in right hand: Secondary | ICD-10-CM | POA: Diagnosis not present

## 2016-07-11 DIAGNOSIS — Z79899 Other long term (current) drug therapy: Secondary | ICD-10-CM | POA: Diagnosis not present

## 2016-07-11 DIAGNOSIS — R110 Nausea: Secondary | ICD-10-CM | POA: Diagnosis not present

## 2016-07-11 DIAGNOSIS — R509 Fever, unspecified: Secondary | ICD-10-CM | POA: Diagnosis not present

## 2016-07-11 HISTORY — DX: Neoplasm related pain (acute) (chronic): G89.3

## 2016-07-25 DIAGNOSIS — Z79899 Other long term (current) drug therapy: Secondary | ICD-10-CM | POA: Diagnosis not present

## 2016-07-25 DIAGNOSIS — C439 Malignant melanoma of skin, unspecified: Secondary | ICD-10-CM | POA: Diagnosis not present

## 2016-07-25 DIAGNOSIS — G7000 Myasthenia gravis without (acute) exacerbation: Secondary | ICD-10-CM | POA: Diagnosis not present

## 2016-07-30 DIAGNOSIS — R509 Fever, unspecified: Secondary | ICD-10-CM | POA: Diagnosis not present

## 2016-07-30 DIAGNOSIS — C179 Malignant neoplasm of small intestine, unspecified: Secondary | ICD-10-CM | POA: Diagnosis not present

## 2016-07-30 DIAGNOSIS — R109 Unspecified abdominal pain: Secondary | ICD-10-CM | POA: Diagnosis not present

## 2016-07-30 DIAGNOSIS — A419 Sepsis, unspecified organism: Secondary | ICD-10-CM | POA: Diagnosis not present

## 2016-07-30 DIAGNOSIS — Z9889 Other specified postprocedural states: Secondary | ICD-10-CM | POA: Diagnosis not present

## 2016-08-21 DIAGNOSIS — G7000 Myasthenia gravis without (acute) exacerbation: Secondary | ICD-10-CM | POA: Diagnosis not present

## 2016-08-21 DIAGNOSIS — Z5181 Encounter for therapeutic drug level monitoring: Secondary | ICD-10-CM | POA: Diagnosis not present

## 2016-08-21 DIAGNOSIS — E119 Type 2 diabetes mellitus without complications: Secondary | ICD-10-CM | POA: Diagnosis not present

## 2016-08-21 DIAGNOSIS — Z79899 Other long term (current) drug therapy: Secondary | ICD-10-CM | POA: Diagnosis not present

## 2016-08-21 DIAGNOSIS — C799 Secondary malignant neoplasm of unspecified site: Secondary | ICD-10-CM | POA: Diagnosis not present

## 2016-08-22 DIAGNOSIS — Z7952 Long term (current) use of systemic steroids: Secondary | ICD-10-CM | POA: Diagnosis not present

## 2016-08-22 DIAGNOSIS — Z79899 Other long term (current) drug therapy: Secondary | ICD-10-CM | POA: Diagnosis not present

## 2016-08-22 DIAGNOSIS — Z8249 Family history of ischemic heart disease and other diseases of the circulatory system: Secondary | ICD-10-CM | POA: Diagnosis not present

## 2016-08-22 DIAGNOSIS — E119 Type 2 diabetes mellitus without complications: Secondary | ICD-10-CM | POA: Diagnosis not present

## 2016-08-22 DIAGNOSIS — R1909 Other intra-abdominal and pelvic swelling, mass and lump: Secondary | ICD-10-CM | POA: Diagnosis not present

## 2016-08-22 DIAGNOSIS — G893 Neoplasm related pain (acute) (chronic): Secondary | ICD-10-CM | POA: Diagnosis not present

## 2016-08-22 DIAGNOSIS — Z7984 Long term (current) use of oral hypoglycemic drugs: Secondary | ICD-10-CM | POA: Diagnosis not present

## 2016-08-22 DIAGNOSIS — J449 Chronic obstructive pulmonary disease, unspecified: Secondary | ICD-10-CM | POA: Diagnosis not present

## 2016-08-22 DIAGNOSIS — G7000 Myasthenia gravis without (acute) exacerbation: Secondary | ICD-10-CM | POA: Diagnosis not present

## 2016-08-22 DIAGNOSIS — I10 Essential (primary) hypertension: Secondary | ICD-10-CM | POA: Diagnosis not present

## 2016-08-22 DIAGNOSIS — Z801 Family history of malignant neoplasm of trachea, bronchus and lung: Secondary | ICD-10-CM | POA: Diagnosis not present

## 2016-08-22 DIAGNOSIS — C7989 Secondary malignant neoplasm of other specified sites: Secondary | ICD-10-CM | POA: Diagnosis not present

## 2016-08-22 DIAGNOSIS — D649 Anemia, unspecified: Secondary | ICD-10-CM | POA: Diagnosis not present

## 2016-08-22 DIAGNOSIS — Z7951 Long term (current) use of inhaled steroids: Secondary | ICD-10-CM | POA: Diagnosis not present

## 2016-08-22 DIAGNOSIS — C439 Malignant melanoma of skin, unspecified: Secondary | ICD-10-CM | POA: Diagnosis not present

## 2016-08-22 DIAGNOSIS — Z7982 Long term (current) use of aspirin: Secondary | ICD-10-CM | POA: Diagnosis not present

## 2016-08-24 ENCOUNTER — Ambulatory Visit (INDEPENDENT_AMBULATORY_CARE_PROVIDER_SITE_OTHER): Payer: PPO | Admitting: Neurology

## 2016-08-24 ENCOUNTER — Encounter: Payer: Self-pay | Admitting: Neurology

## 2016-08-24 VITALS — BP 133/71 | HR 66 | Ht 69.0 in | Wt 171.5 lb

## 2016-08-24 DIAGNOSIS — G7000 Myasthenia gravis without (acute) exacerbation: Secondary | ICD-10-CM

## 2016-08-24 MED ORDER — PYRIDOSTIGMINE BROMIDE 60 MG PO TABS: 60.0000 mg | ORAL_TABLET | Freq: Four times a day (QID) | ORAL | 3 refills | Status: DC

## 2016-08-24 NOTE — Progress Notes (Signed)
Reason for visit: Myasthenia gravis  Colin Reyes is an 76 y.o. male  History of present illness:  Colin Reyes is a 76 year old right-handed white male with a history of myasthenia gravis. Previously, he has had mainly ocular features, but more recently he has had recurrence of his melanoma that occurred in February 2018. The patient had surgery on 04/10/2016, he has had some problems with anemia since that time that has improved some. His myasthenic symptoms worsened with this exacerbation. He is on chemotherapy for the melanoma. He has noted increased problems with weakness of the left greater than right arm, he has had some worsening of the walking, neck pain, and a difficult time keeping the head up. The patient may have some fatigue with chewing, he denies any choking with swallowing. He has not had any shortness of breath. He has been seen by Dr. Nadara Mustard from [facility], plans are to have a single fiber EMG evaluation. The patient has been placed on prednisone taking 10 mg daily. He has been running some fevers associated with the chemotherapy. He has lost about 20 pounds of weight since he was seen here last year. He has had a reduction in appetite associated with the cancer and with chemotherapy. He returns for an evaluation. The Mestinon dose has been increased from 60 mg twice daily to 3 times daily. He is tolerating the drug well.  Past Medical History:  Diagnosis Date  . Diabetes mellitus ([place])   . Dyslipidemia   . Hypertension   . Melanoma ([place]) 2009   Left chest wall   . Myasthenia gravis ([place]) 2009  . Obesity     Past Surgical History:  Procedure Laterality Date  . AXILLARY LYMPH NODE DISSECTION    . BASAL CELL CARCINOMA EXCISION  11/08/12, 11/29/12  . UMBILICAL HERNIA REPAIR  10/27/12    Family History  Problem Relation Age of Onset  . Cancer - Lung Father   . Cancer - Prostate Brother     Social history:  reports that he quit smoking about 26 years ago. He  has never used smokeless tobacco. He reports that he does not drink alcohol or use drugs.    Allergies  Allergen Reactions  . Effexor [Venlafaxine Hcl]     Medications:  Prior to Admission medications   Medication Sig Start Date End Date Taking? Authorizing Provider  ADVAIR DISKUS 250-50 MCG/DOSE AEPB Inhale 250 puffs into the lungs 2 (two) times daily. 05/29/12  Yes [provider]  atenolol (TENORMIN) 25 MG tablet Take 25 mg by mouth daily.  06/04/12  Yes [provider]  AVODART 0.5 MG capsule Take 0.5 mg by mouth daily. 05/16/12  Yes [provider]  Cholecalciferol (VITAMIN D) 2000 UNITS CAPS Take 2,000 Units by mouth daily.   Yes [provider]  [employer] PURELAX powder 1 CAPFUL ONE TO TWO TIMES DAILY MIXED WITH WATER OR JUICE FOR CONSTIPATION 07/28/15  Yes [provider]  dabrafenib mesylate (TAFINLAR) 75 MG capsule Take 75 mg by mouth 2 (two) times daily. Take on an empty stomach 1 hour before or 2 hours after meals.   Yes [provider]  glipiZIDE (GLUCOTROL XL) 10 MG 24 hr tablet Take 10 mg by mouth 2 (two) times daily. 05/25/12  Yes [provider]  JENTADUETO 2.05-998 MG TABS Take 2.5 mg by mouth daily. 05/27/13  Yes [provider]  losartan (COZAAR) 25 MG tablet Take 25 mg by mouth 2 (two) times  daily.  06/03/12  Yes [provider]  ONE TOUCH ULTRA TEST test strip  06/12/12  Yes [provider]  predniSONE (DELTASONE) 10 MG tablet Take 10 mg by mouth daily with breakfast.   Yes [provider]  Probiotic Product (PROBIOTIC DAILY PO) Take by mouth daily.   Yes [provider]  pyridostigmine (MESTINON) 60 MG tablet Take 1 tablet (60 mg total) by mouth 3 (three) times daily. 08/23/15  Yes Kathrynn Ducking, MD  simvastatin (ZOCOR) 20 MG tablet Take 20 mg by mouth daily. 05/20/12  Yes [provider]  trametinib dimethyl sulfoxide (MEKINIST) 0.5 MG tablet Take 1 mg by mouth daily.  Take 1 hour before or 2 hours after a meal. Store refrigerated in original container.   Yes [provider]    ROS:  Out of a complete 14 system review of symptoms, the patient complains only of the following symptoms, and all other reviewed systems are negative.  Light sensitivity Back pain, walking difficulty, neck pain Bruising easily  Blood pressure 133/71, pulse 66, height 5\' 9"  (1.753 m), weight 171 lb 8 oz (77.8 kg).  Physical Exam  General: The patient is alert and cooperative at the time of the examination.  Skin: No significant peripheral edema is noted.   Neurologic Exam  Mental status: The patient is alert and oriented x 3 at the time of the examination. The patient has apparent normal recent and remote memory, with an apparently normal attention span and concentration ability.   Cranial nerves: Facial symmetry is present. Speech is normal, no aphasia or dysarthria is noted. Extraocular movements are full. Visual fields are full. With superior gaze for 1 minute, no increased ptosis or divergence of gaze was seen, no reports of double vision was noted. The patient has good strength with head action and extension.  Motor: The patient has good strength in all 4 extremities. With arms outstretched for 1 minute, fatigable weakness of the left deltoid was noted. The right side was unaffected.  Sensory examination: Soft touch sensation is symmetric on the face, arms, and legs.  Coordination: The patient has good finger-nose-finger and heel-to-shin bilaterally.  Gait and station: The patient has a normal gait. Tandem gait is slightly unsteady. Romberg is negative. No drift is seen.  Reflexes: Deep tendon reflexes are symmetric.   Assessment/Plan:  1. Myasthenia gravis  2. Melanoma with recent recurrence  The patient has done less well with his myasthenia since onset of his recurrence of melanoma. The patient is now on low-dose prednisone taking 10 mg daily, we  will increase his Mestinon taking 60 mg 4 times daily, the patient will follow-up in 3-4 months. The patient is also followed through neurology at [facility]. He will have a single fiber EMG at some point.  Jill Alexanders MD 08/24/2016 8:06 AM  [facility] [address] [place], [place] [address]  Phone [phone] Fax [phone]

## 2016-08-24 NOTE — Patient Instructions (Signed)
   We will increase the Mestinon 60 mg to 4 tablets daily.

## 2016-09-05 DIAGNOSIS — G7000 Myasthenia gravis without (acute) exacerbation: Secondary | ICD-10-CM | POA: Diagnosis not present

## 2016-10-03 DIAGNOSIS — G7000 Myasthenia gravis without (acute) exacerbation: Secondary | ICD-10-CM | POA: Diagnosis not present

## 2016-10-03 DIAGNOSIS — J449 Chronic obstructive pulmonary disease, unspecified: Secondary | ICD-10-CM | POA: Diagnosis not present

## 2016-10-03 DIAGNOSIS — Z7984 Long term (current) use of oral hypoglycemic drugs: Secondary | ICD-10-CM | POA: Diagnosis not present

## 2016-10-03 DIAGNOSIS — Z7952 Long term (current) use of systemic steroids: Secondary | ICD-10-CM | POA: Diagnosis not present

## 2016-10-03 DIAGNOSIS — Z7951 Long term (current) use of inhaled steroids: Secondary | ICD-10-CM | POA: Diagnosis not present

## 2016-10-03 DIAGNOSIS — Z79899 Other long term (current) drug therapy: Secondary | ICD-10-CM | POA: Diagnosis not present

## 2016-10-03 DIAGNOSIS — R911 Solitary pulmonary nodule: Secondary | ICD-10-CM | POA: Diagnosis not present

## 2016-10-03 DIAGNOSIS — I10 Essential (primary) hypertension: Secondary | ICD-10-CM | POA: Diagnosis not present

## 2016-10-03 DIAGNOSIS — R918 Other nonspecific abnormal finding of lung field: Secondary | ICD-10-CM | POA: Diagnosis not present

## 2016-10-03 DIAGNOSIS — C439 Malignant melanoma of skin, unspecified: Secondary | ICD-10-CM | POA: Diagnosis not present

## 2016-10-03 DIAGNOSIS — Z7982 Long term (current) use of aspirin: Secondary | ICD-10-CM | POA: Diagnosis not present

## 2016-10-03 DIAGNOSIS — E119 Type 2 diabetes mellitus without complications: Secondary | ICD-10-CM | POA: Diagnosis not present

## 2016-10-03 DIAGNOSIS — C799 Secondary malignant neoplasm of unspecified site: Secondary | ICD-10-CM | POA: Diagnosis not present

## 2016-10-04 DIAGNOSIS — E1142 Type 2 diabetes mellitus with diabetic polyneuropathy: Secondary | ICD-10-CM | POA: Diagnosis not present

## 2016-10-04 DIAGNOSIS — H6121 Impacted cerumen, right ear: Secondary | ICD-10-CM | POA: Diagnosis not present

## 2016-10-04 DIAGNOSIS — C784 Secondary malignant neoplasm of small intestine: Secondary | ICD-10-CM | POA: Diagnosis not present

## 2016-10-04 DIAGNOSIS — E1121 Type 2 diabetes mellitus with diabetic nephropathy: Secondary | ICD-10-CM | POA: Diagnosis not present

## 2016-10-04 DIAGNOSIS — C4359 Malignant melanoma of other part of trunk: Secondary | ICD-10-CM | POA: Diagnosis not present

## 2016-10-04 DIAGNOSIS — E782 Mixed hyperlipidemia: Secondary | ICD-10-CM | POA: Diagnosis not present

## 2016-10-04 DIAGNOSIS — I10 Essential (primary) hypertension: Secondary | ICD-10-CM | POA: Diagnosis not present

## 2016-10-04 DIAGNOSIS — Z23 Encounter for immunization: Secondary | ICD-10-CM | POA: Diagnosis not present

## 2016-10-04 DIAGNOSIS — G7000 Myasthenia gravis without (acute) exacerbation: Secondary | ICD-10-CM | POA: Diagnosis not present

## 2016-10-05 DIAGNOSIS — L570 Actinic keratosis: Secondary | ICD-10-CM | POA: Diagnosis not present

## 2016-11-23 ENCOUNTER — Other Ambulatory Visit: Payer: Self-pay | Admitting: Neurology

## 2016-12-29 ENCOUNTER — Encounter: Payer: Self-pay | Admitting: Neurology

## 2016-12-29 ENCOUNTER — Ambulatory Visit: Payer: PPO | Admitting: Neurology

## 2016-12-29 VITALS — BP 141/67 | HR 62 | Ht 69.0 in | Wt 183.5 lb

## 2016-12-29 DIAGNOSIS — G7000 Myasthenia gravis without (acute) exacerbation: Secondary | ICD-10-CM | POA: Diagnosis not present

## 2016-12-29 NOTE — Progress Notes (Signed)
Reason for visit: Myasthenia gravis  Colin Reyes is an 76 y.o. male  History of present illness:  Mr. Colin Reyes is a 76 year old right-handed white male with a history of myasthenia gravis and a history of melanoma.  The patient is being treated for the melanoma, he has gotten a good response so far.  He is somewhat stable with his myasthenia, he continues to have some troubles with fatigue with chewing 2-3 times a week.  He tries to eat softer foods to alleviate these symptoms.  The patient may have some generalized fatigue issues as well.  He denies any double vision, he may have ptosis at times but this does not close down the eyes.  The patient reports some neck pain and left shoulder discomfort.  He denies pain going down the arms.  The patient remains on Mestinon 60 mg 4 times daily, he is tolerating this well.  He is on prednisone 10 mg daily mainly because of the melanoma.  The patient returns to this office for an evaluation.  He denies any problems with swallowing or choking.   Past Medical History:  Diagnosis Date  . Diabetes mellitus ([place] Re[place])   . Dyslipidemia   . Hypertension   . Melanoma ([place] Re[place]) 2009   Left chest wall   . Myasthenia gravis ([place]in Re[place]) 2009  . Obesity     Past Surgical History:  Procedure Laterality Date  . AXILLARY LYMPH NODE DISSECTION    . BASAL CELL CARCINOMA EXCISION  11/08/12, 11/29/12  . MELANOMA EXCISION     x 4 since 2009 (last being in 04/2016 - abdomen/back)  . UMBILICAL HERNIA REPAIR  10/27/12    Family History  Problem Relation Age of Onset  . Cancer - Lung Father   . Cancer - Prostate Brother     Social history:  reports that he quit smoking about 26 years ago. he has never used smokeless tobacco. He reports that he does not drink alcohol or use drugs.    Allergies  Allergen Reactions  . Effexor [Venlafaxine Hcl]     Medications:  Prior to Admission medications   Medication Sig Start Date End Date Taking? Authorizing Provider  ADVAIR  DISKUS 250-50 MCG/DOSE AEPB Inhale 250 puffs into the lungs 2 (two) times daily. 05/29/12  Yes [provider]  atenolol (TENORMIN) 25 MG tablet Take 25 mg by mouth daily.  06/04/12  Yes [provider]  AVODART 0.5 MG capsule Take 0.5 mg by mouth daily. 05/16/12  Yes [provider]  Cholecalciferol (VITAMIN D) 2000 UNITS CAPS Take 2,000 Units by mouth daily.   Yes [provider]  [employer] PURELAX powder 1 CAPFUL ONE TO TWO TIMES DAILY MIXED WITH WATER OR JUICE FOR CONSTIPATION 07/28/15  Yes [provider]  dabrafenib mesylate (TAFINLAR) 75 MG capsule Take 75 mg by mouth 2 (two) times daily. Take on an empty stomach 1 hour before or 2 hours after meals.   Yes [provider]  glipiZIDE (GLUCOTROL XL) 10 MG 24 hr tablet Take 10 mg by mouth 2 (two) times daily. 05/25/12  Yes [provider]  JENTADUETO 2.05-998 MG TABS Take 2.5 mg by mouth daily. 05/27/13  Yes [provider]  losartan (COZAAR) 25 MG tablet Take 25 mg by mouth 2 (two) times daily.  06/03/12  Yes [provider]  ONE TOUCH ULTRA TEST test strip  06/12/12  Yes [provider]  predniSONE (DELTASONE) 10 MG tablet Take 10 mg by mouth daily  with breakfast.   Yes [provider]  Probiotic Product (PROBIOTIC DAILY PO) Take by mouth daily.   Yes [provider]  pyridostigmine (MESTINON) 60 MG tablet Take 1 tablet (60 mg total) by mouth 4 (four) times daily. 08/24/16  Yes Kathrynn Ducking, MD  simvastatin (ZOCOR) 20 MG tablet Take 20 mg by mouth daily. 05/20/12  Yes [provider]  trametinib dimethyl sulfoxide (MEKINIST) 0.5 MG tablet Take 1 mg by mouth daily. Take 1 hour before or 2 hours after a meal. Store refrigerated in original container.   Yes [provider]    ROS:  Out of a complete 14 system review of symptoms, the patient complains only of the following symptoms, and all other reviewed systems are negative.  Leg  swelling Back pain, muscle cramps, neck pain Weakness  Blood pressure (!) 141/67, pulse 62, height 5\' 9"  (1.753 m), weight 183 lb 8 oz (83.2 kg).  Physical Exam  General: The patient is alert and cooperative at the time of the examination.  Skin: No significant peripheral edema is noted.   Neurologic Exam  Mental status: The patient is alert and oriented x 3 at the time of the examination. The patient has apparent normal recent and remote memory, with an apparently normal attention span and concentration ability.   Cranial nerves: Facial symmetry is present. Speech is normal, no aphasia or dysarthria is noted. Extraocular movements are full. Visual fields are full.  With superior deviation for 1 minute, no subjective double vision is seen, no divergence of gaze is noted and no evidence of ptosis is seen.  Motor: The patient has good strength in all 4 extremities.  With arms outstretched 1 minute, no fatigable weakness of the deltoid muscles were noted on either side.  Sensory examination: Soft touch sensation is symmetric on the face, arms, and legs.  Coordination: The patient has good finger-nose-finger and heel-to-shin bilaterally.  Gait and station: The patient has a normal gait. Tandem gait is unsteady. Romberg is negative. No drift is seen.  Reflexes: Deep tendon reflexes are symmetric.   Assessment/Plan:  1.  Myasthenia gravis  2.  Melanoma  The patient will remain on his current dose of Mestinon and prednisone.  He indicates that the problems with chewing is not a significant issue for him, he denies problems swallowing.  He will contact our office if the symptoms worsen over time, we may have to go up on the prednisone.  The patient otherwise will follow-up in 6 months.  Jill Alexanders MD 12/29/2016 8:48 AM  [facility] [address] [place], [place] [address]  Phone [phone] Fax [phone]

## 2017-01-05 DIAGNOSIS — M791 Myalgia, unspecified site: Secondary | ICD-10-CM | POA: Diagnosis not present

## 2017-01-05 DIAGNOSIS — E782 Mixed hyperlipidemia: Secondary | ICD-10-CM | POA: Diagnosis not present

## 2017-01-05 DIAGNOSIS — E1142 Type 2 diabetes mellitus with diabetic polyneuropathy: Secondary | ICD-10-CM | POA: Diagnosis not present

## 2017-01-05 DIAGNOSIS — E1121 Type 2 diabetes mellitus with diabetic nephropathy: Secondary | ICD-10-CM | POA: Diagnosis not present

## 2017-01-05 DIAGNOSIS — I10 Essential (primary) hypertension: Secondary | ICD-10-CM | POA: Diagnosis not present

## 2017-01-05 DIAGNOSIS — C4359 Malignant melanoma of other part of trunk: Secondary | ICD-10-CM | POA: Diagnosis not present

## 2017-01-05 DIAGNOSIS — C784 Secondary malignant neoplasm of small intestine: Secondary | ICD-10-CM | POA: Diagnosis not present

## 2017-01-05 DIAGNOSIS — G7000 Myasthenia gravis without (acute) exacerbation: Secondary | ICD-10-CM | POA: Diagnosis not present

## 2017-01-05 DIAGNOSIS — Z6828 Body mass index (BMI) 28.0-28.9, adult: Secondary | ICD-10-CM | POA: Diagnosis not present

## 2017-01-25 DIAGNOSIS — R911 Solitary pulmonary nodule: Secondary | ICD-10-CM | POA: Diagnosis not present

## 2017-01-25 DIAGNOSIS — G7000 Myasthenia gravis without (acute) exacerbation: Secondary | ICD-10-CM | POA: Diagnosis not present

## 2017-01-25 DIAGNOSIS — J181 Lobar pneumonia, unspecified organism: Secondary | ICD-10-CM | POA: Diagnosis not present

## 2017-01-25 DIAGNOSIS — R918 Other nonspecific abnormal finding of lung field: Secondary | ICD-10-CM | POA: Diagnosis not present

## 2017-01-25 DIAGNOSIS — C799 Secondary malignant neoplasm of unspecified site: Secondary | ICD-10-CM | POA: Diagnosis not present

## 2017-01-25 DIAGNOSIS — C439 Malignant melanoma of skin, unspecified: Secondary | ICD-10-CM | POA: Diagnosis not present

## 2017-01-25 DIAGNOSIS — Z79899 Other long term (current) drug therapy: Secondary | ICD-10-CM | POA: Diagnosis not present

## 2017-03-08 DIAGNOSIS — Z79899 Other long term (current) drug therapy: Secondary | ICD-10-CM | POA: Diagnosis not present

## 2017-03-08 DIAGNOSIS — Z7984 Long term (current) use of oral hypoglycemic drugs: Secondary | ICD-10-CM | POA: Diagnosis not present

## 2017-03-08 DIAGNOSIS — M25552 Pain in left hip: Secondary | ICD-10-CM | POA: Diagnosis not present

## 2017-03-08 DIAGNOSIS — C784 Secondary malignant neoplasm of small intestine: Secondary | ICD-10-CM | POA: Diagnosis not present

## 2017-03-08 DIAGNOSIS — Z7982 Long term (current) use of aspirin: Secondary | ICD-10-CM | POA: Diagnosis not present

## 2017-03-08 DIAGNOSIS — E119 Type 2 diabetes mellitus without complications: Secondary | ICD-10-CM | POA: Diagnosis not present

## 2017-03-08 DIAGNOSIS — C439 Malignant melanoma of skin, unspecified: Secondary | ICD-10-CM | POA: Diagnosis not present

## 2017-03-08 DIAGNOSIS — Z7952 Long term (current) use of systemic steroids: Secondary | ICD-10-CM | POA: Diagnosis not present

## 2017-03-08 DIAGNOSIS — C799 Secondary malignant neoplasm of unspecified site: Secondary | ICD-10-CM | POA: Diagnosis not present

## 2017-03-08 DIAGNOSIS — G7000 Myasthenia gravis without (acute) exacerbation: Secondary | ICD-10-CM | POA: Diagnosis not present

## 2017-03-08 DIAGNOSIS — M25559 Pain in unspecified hip: Secondary | ICD-10-CM | POA: Diagnosis not present

## 2017-03-08 DIAGNOSIS — Z7951 Long term (current) use of inhaled steroids: Secondary | ICD-10-CM | POA: Diagnosis not present

## 2017-04-06 DIAGNOSIS — I10 Essential (primary) hypertension: Secondary | ICD-10-CM | POA: Diagnosis not present

## 2017-04-06 DIAGNOSIS — C4359 Malignant melanoma of other part of trunk: Secondary | ICD-10-CM | POA: Diagnosis not present

## 2017-04-06 DIAGNOSIS — C784 Secondary malignant neoplasm of small intestine: Secondary | ICD-10-CM | POA: Diagnosis not present

## 2017-04-06 DIAGNOSIS — E1142 Type 2 diabetes mellitus with diabetic polyneuropathy: Secondary | ICD-10-CM | POA: Diagnosis not present

## 2017-04-06 DIAGNOSIS — E782 Mixed hyperlipidemia: Secondary | ICD-10-CM | POA: Diagnosis not present

## 2017-04-06 DIAGNOSIS — D6489 Other specified anemias: Secondary | ICD-10-CM | POA: Diagnosis not present

## 2017-04-06 DIAGNOSIS — G7000 Myasthenia gravis without (acute) exacerbation: Secondary | ICD-10-CM | POA: Diagnosis not present

## 2017-04-06 DIAGNOSIS — Z7952 Long term (current) use of systemic steroids: Secondary | ICD-10-CM | POA: Diagnosis not present

## 2017-04-06 DIAGNOSIS — I208 Other forms of angina pectoris: Secondary | ICD-10-CM | POA: Diagnosis not present

## 2017-04-06 DIAGNOSIS — E1121 Type 2 diabetes mellitus with diabetic nephropathy: Secondary | ICD-10-CM | POA: Diagnosis not present

## 2017-04-09 DIAGNOSIS — E1121 Type 2 diabetes mellitus with diabetic nephropathy: Secondary | ICD-10-CM | POA: Diagnosis not present

## 2017-04-17 DIAGNOSIS — C44519 Basal cell carcinoma of skin of other part of trunk: Secondary | ICD-10-CM | POA: Diagnosis not present

## 2017-04-17 DIAGNOSIS — R233 Spontaneous ecchymoses: Secondary | ICD-10-CM | POA: Diagnosis not present

## 2017-04-17 DIAGNOSIS — L570 Actinic keratosis: Secondary | ICD-10-CM | POA: Diagnosis not present

## 2017-04-17 DIAGNOSIS — R079 Chest pain, unspecified: Secondary | ICD-10-CM | POA: Diagnosis not present

## 2017-04-19 DIAGNOSIS — R9439 Abnormal result of other cardiovascular function study: Secondary | ICD-10-CM | POA: Insufficient documentation

## 2017-04-19 HISTORY — DX: Abnormal result of other cardiovascular function study: R94.39

## 2017-04-19 NOTE — Progress Notes (Signed)
Cardiology Office Note:    Date:  04/20/2017   ID:  Colin Reyes, DOB 1940-08-19, MRN [id number]  PCP:  Rochel Brome, MD  Cardiologist:  Shirlee More, MD   Referring MD: Rochel Brome, MD  ASSESSMENT:    1. Angina pectoris ([place])   2. Abnormal stress echo   3. Metastatic melanoma ([place])   4. Essential hypertension    PLAN:    In order of problems listed above:  1. He has typical exertional angina abnormal stress echo he will be treated medically with his aspirin statin intensified beta-blocker and additional nitrates L a cardiac CTA to define the extent and severity of coronary stenosis and reassess back in the office in 1 month. 2. For cardiac CTA to define severity and significance of coronary stenosis 3. Stable managed by oncology he has a good quality of life 4. Stable hypertension blood pressure target continue current treatment 5. Dyslipidemia and diabetes are stable continue current treatment  Next appointment 4 weeks   Medication Adjustments/Labs and Tests Ordered: Current medicines are reviewed at length with the patient today.  Concerns regarding medicines are outlined above.  Orders Placed This Encounter  Procedures  . CT CORONARY MORPH W/CTA COR W/SCORE W/CA W/CM &/OR WO/CM  . CT CORONARY FRACTIONAL FLOW RESERVE DATA PREP  . CT CORONARY FRACTIONAL FLOW RESERVE FLUID ANALYSIS   Meds ordered this encounter  Medications  . isosorbide mononitrate (IMDUR) 30 MG 24 hr tablet    Sig: Take 1 tablet (30 mg total) by mouth daily.    Dispense:  30 tablet    Refill:  11  . metoprolol succinate (TOPROL-XL) 50 MG 24 hr tablet    Sig: Take 1 tablet (50 mg total) by mouth daily. Take with or immediately following a meal.    Dispense:  30 tablet    Refill:  11     Chief Complaint  Patient presents with  . New Patient (Initial Visit)    per Dr Tobie Poet to evaulate abnormal stress echo at [facility]   . Chest Pain    History of Present Illness:    Colin Reyes is a 77 y.o. male  with a history of myasthenia gravis and a history of metastatic melanoma treated with dabrafenib/trametinib  who is being seen today for the evaluation of an abnormal stress test performed at [facility]  at the request of Cox, Kirsten, MD. He is referred today after undergoing a recent outpatient stress echo which is abnormal with a ischemic EKG as well as limited mild echo ischemia.  He has noticed for several weeks with physical effort such as walking on an incline using his upper extremities that he develops typical angina precordial tightness that radiates to the shoulders associated with shortness of breath that is relieved with rest.  Activity so far of occur with vigorous exertion and have not been at night or during the day.  There is no pleuritic component diaphoresis nausea or vomiting and is always been relieved with rest.  We reviewed options for further evaluation he will undergo cardiac CTA I will switch him to a more effective beta-blocker atenolol to Toprol start oral nitrates and reassess his response to medical therapy in the office in 4 weeks.  He is given a prescription for nitroglycerin to use as needed.  Past Medical History:  Diagnosis Date  . Abnormal stress echo 04/19/2017  . Acute respiratory failure ([place]) 09/30/2012  . Anemia 03/30/2016  . Basal cell carcinoma 04/20/2017  . Cancer  related pain 07/11/2016  . COPD (chronic obstructive pulmonary disease) ([place]) 12/05/2011  . Diabetes mellitus ([place])   . Diplopia 12/05/2011  . Dyslipidemia   . Essential hypertension 12/05/2011  . GI bleed 04/20/2017   02/2016   . H/O melanoma excision 09/30/2012  . High risk medication use 06/21/2016  . Hx of umbilical hernia repair 07/29/5463  . Hyperlipidemia 04/20/2017  . Hypertension   . Melanoma ([place]) 2009   Left chest wall   . Melanoma of skin, site unspecified 12/05/2011  . Metastatic melanoma ([place]) 12/05/2011  . Myasthenia gravis ([place]) 2009  . Obesity   . Other general symptoms(780.99) 12/05/2011    . Other specified visual disturbances 12/05/2011  . Pneumonia 09/30/2012  . Psychosocial distress 06/21/2016  . Pulmonary edema 09/30/2012  . 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 Squamous cell carcinoma 04/20/2017  . Type 2 diabetes mellitus ([place]) 09/30/2012  . Type II or unspecified type diabetes mellitus with neurological manifestations, not stated as uncontrolled(250.60) 12/05/2011  . Unspecified ptosis of eyelid 12/05/2011    Past Surgical History:  Procedure Laterality Date  . AXILLARY LYMPH NODE DISSECTION    . BASAL CELL CARCINOMA EXCISION  11/08/12, 11/29/12  . ENTERECTOMY    . LITHOTRIPSY    . MELANOMA EXCISION     x 4 since 2009 (last being in 04/2016 - abdomen/back)  . RENAL ARTERY STENT    . UMBILICAL HERNIA REPAIR  10/27/12    Current Medications: Current Meds  Medication Sig  . ADVAIR DISKUS 250-50 MCG/DOSE AEPB Inhale 250 puffs into the lungs 2 (two) times daily.  Marland Kitchen aspirin EC 81 MG tablet Take 81 mg by mouth daily.  . AVODART 0.5 MG capsule Take 0.5 mg by mouth daily.  . Calcium Carbonate (CALCIUM 600 PO) Take 1 tablet by mouth daily.  . Cholecalciferol (VITAMIN D) 2000 UNITS CAPS Take 2,000 Units by mouth daily.  Marland Kitchen dabrafenib mesylate (TAFINLAR) 75 MG capsule Take 75 mg by mouth 2 (two) times daily. Take on an empty stomach 1 hour before or 2 hours after meals.  Marland Kitchen glipiZIDE (GLUCOTROL XL) 10 MG 24 hr tablet Take 10 mg by mouth 2 (two) times daily.  Marland Kitchen losartan (COZAAR) 25 MG tablet Take 25 mg by mouth 2 (two) times daily.   . Magnesium 250 MG TABS Take 1 tablet by mouth daily.  . ONE TOUCH ULTRA TEST test strip   . predniSONE (DELTASONE) 1 MG tablet Take 3 mg by mouth daily with breakfast.  . predniSONE (DELTASONE) 5 MG tablet Take 5 mg by mouth daily with breakfast.   . Probiotic Product (PROBIOTIC DAILY PO) Take by mouth daily.  Marland Kitchen pyridostigmine (MESTINON) 60 MG tablet Take 1 tablet (60 mg total) by mouth 4 (four) times daily.  . simvastatin (ZOCOR) 20 MG  tablet Take 20 mg by mouth daily.  . SitaGLIPtin-MetFORMIN HCl (JANUMET XR) 50-1000 MG TB24 Take 2 tablets by mouth daily.  . traMADol (ULTRAM) 50 MG tablet Take 50 mg by mouth 2 (two) times daily.  . trametinib dimethyl sulfoxide (MEKINIST) 0.5 MG tablet Take 1 mg by mouth 2 (two) times daily. Take 1 hour before or 2 hours after a meal. Store refrigerated in original container.   . [DISCONTINUED] atenolol (TENORMIN) 25 MG tablet Take 25 mg by mouth daily.      Allergies:   Effexor [venlafaxine hcl]   Social History   Socioeconomic History  . Marital status: Widowed    Spouse name: Not on file  .  Number of children: 2  . Years of education: College  . Highest education level: Not on file  Occupational History  . Occupation: Retired    Comment: Dance movement psychotherapist  Social Needs  . Financial resource strain: Not on file  . Food insecurity:    Worry: Not on file    Inability: Not on file  . Transportation needs:    Medical: Not on file    Non-medical: Not on file  Tobacco Use  . Smoking status: Former Smoker    Last attempt to quit: 01/23/1990    Years since quitting: 27.2  . Smokeless tobacco: Never Used  Substance and Sexual Activity  . Alcohol use: No    Alcohol/week: 0.0 oz  . Drug use: No  . Sexual activity: Never  Lifestyle  . Physical activity:    Days per week: Not on file    Minutes per session: Not on file  . Stress: Not on file  Relationships  . Social connections:    Talks on phone: Not on file    Gets together: Not on file    Attends religious service: Not on file    Active member of club or organization: Not on file    Attends meetings of clubs or organizations: Not on file    Relationship status: Not on file  Other Topics Concern  . Not on file  Social History Narrative   Patient is right handed.   Patient drinks about 4 cups of coffee a day.   Patient at home      Family History: The patient's family history includes Cancer - Lung in his father;  Cancer - Prostate in his brother; Hypertension in his mother.  ROS:   Review of Systems  Constitution: Positive for malaise/fatigue.  HENT: Negative.   Eyes: Negative.   Cardiovascular: Positive for chest pain and dyspnea on exertion.  Respiratory: Positive for shortness of breath.   Endocrine: Negative.   Hematologic/Lymphatic: Negative.   Skin: Negative.   Musculoskeletal: Negative.   Gastrointestinal: Negative.   Genitourinary: Negative.   Neurological: Negative.   Psychiatric/Behavioral: Negative.   Allergic/Immunologic: Negative.    Please see the history of present illness.     All other systems reviewed and are negative.  EKGs/Labs/Other Studies Reviewed:    The following studies were reviewed today: Records from his PCP as well as stress echo were independently reviewed prior to visit  EKG:  EKG is  ordered today.  The ekg ordered today demonstrates sinus rhythm normal Stress echo 04/17/17 with ischemic ST and Sob at 5.6 mets, normal BP response and " subtle" septal hypokinesia Recent Labs: No results found for requested labs within last 8760 hours.  Recent Lipid Panel No results found for: CHOL, TRIG, HDL, CHOLHDL, VLDL, LDLCALC, LDLDIRECT  Physical Exam:    VS:  BP 122/60 (BP Location: Right Arm, Patient Position: Sitting, Cuff Size: Normal)   Pulse 72   Ht 5\' 9"  (1.753 m)   Wt 187 lb (84.8 kg)   SpO2 98%   BMI 27.62 kg/m     Wt Readings from Last 3 Encounters:  04/20/17 187 lb (84.8 kg)  12/29/16 183 lb 8 oz (83.2 kg)  08/24/16 171 lb 8 oz (77.8 kg)     GEN:  Well nourished, well developed in no acute distress HEENT: Normal NECK: No JVD; No carotid bruits LYMPHATICS: No lymphadenopathy CARDIAC: RRR, no murmurs, rubs, gallops RESPIRATORY:  Clear to auscultation without rales, wheezing or rhonchi  ABDOMEN: Soft,  non-tender, non-distended MUSCULOSKELETAL:  No edema; No deformity  SKIN: Warm and dry NEUROLOGIC:  Alert and oriented x 3 PSYCHIATRIC:   Normal affect     Signed, Shirlee More, MD  04/20/2017 1:17 PM    [facility]

## 2017-04-20 ENCOUNTER — Ambulatory Visit (INDEPENDENT_AMBULATORY_CARE_PROVIDER_SITE_OTHER): Payer: PPO | Admitting: Cardiology

## 2017-04-20 VITALS — BP 122/60 | HR 72 | Ht 69.0 in | Wt 187.0 lb

## 2017-04-20 DIAGNOSIS — C4491 Basal cell carcinoma of skin, unspecified: Secondary | ICD-10-CM | POA: Insufficient documentation

## 2017-04-20 DIAGNOSIS — R9439 Abnormal result of other cardiovascular function study: Secondary | ICD-10-CM

## 2017-04-20 DIAGNOSIS — K922 Gastrointestinal hemorrhage, unspecified: Secondary | ICD-10-CM | POA: Insufficient documentation

## 2017-04-20 DIAGNOSIS — IMO0002 Reserved for concepts with insufficient information to code with codable children: Secondary | ICD-10-CM | POA: Insufficient documentation

## 2017-04-20 DIAGNOSIS — I10 Essential (primary) hypertension: Secondary | ICD-10-CM | POA: Diagnosis not present

## 2017-04-20 DIAGNOSIS — C799 Secondary malignant neoplasm of unspecified site: Secondary | ICD-10-CM

## 2017-04-20 DIAGNOSIS — C439 Malignant melanoma of skin, unspecified: Secondary | ICD-10-CM

## 2017-04-20 DIAGNOSIS — E785 Hyperlipidemia, unspecified: Secondary | ICD-10-CM | POA: Insufficient documentation

## 2017-04-20 DIAGNOSIS — I209 Angina pectoris, unspecified: Secondary | ICD-10-CM

## 2017-04-20 HISTORY — DX: Basal cell carcinoma of skin, unspecified: C44.91

## 2017-04-20 HISTORY — DX: Hyperlipidemia, unspecified: E78.5

## 2017-04-20 HISTORY — DX: Reserved for concepts with insufficient information to code with codable children: IMO0002

## 2017-04-20 HISTORY — DX: Gastrointestinal hemorrhage, unspecified: K92.2

## 2017-04-20 MED ORDER — METOPROLOL SUCCINATE ER 50 MG PO TB24: 50.0000 mg | ORAL_TABLET | Freq: Every day | ORAL | 11 refills | Status: AC

## 2017-04-20 MED ORDER — ISOSORBIDE MONONITRATE ER 30 MG PO TB24: 30.0000 mg | ORAL_TABLET | Freq: Every day | ORAL | 11 refills | Status: AC

## 2017-04-20 NOTE — Patient Instructions (Addendum)
Medication Instructions:  Your physician has recommended you make the following change in your medication:  STOP atenolol  START metoprolol succinate (Toprol XL) 50 mg daily START isosorbide (Imdur) 30 mg daily  Labwork: None  Testing/Procedures: You had an EKG today.  Your physician has requested that you have cardiac CT. Cardiac computed tomography (CT) is a painless test that uses an x-ray machine to take clear, detailed pictures of your heart. For further information please visit [employer]uge[employer]iesta[employer]tn. Please follow instruction sheet as given.  Please arrive at the [facility] main entrance of [facility] at xx:xx AM (30-45 minutes prior to test start time)  [facility] [place], [place] [address] [phone]  Proceed to the [facility] Radiology Department (First Floor).  Please follow these instructions carefully (unless otherwise directed):  Hold all erectile dysfunction medications at least 48 hours prior to test.  On the Night Before the Test: . Drink plenty of water. . Do not consume any caffeinated/decaffeinated beverages or chocolate 12 hours prior to your test. . Do not take any antihistamines 12 hours prior to your test. . If you take Metformin do not take 24 hours prior to test.-Jun[place]et  On the Day of the Test: . Drink plenty of water. Do not drink any water within one hour of the test. . Do not eat any food 4 hours prior to the test. . You may take your regular medications prior to the test.  After the Test: . Drink plenty of water. . After receiving IV contrast, you may experience a mild flushed feeling. This is normal. . On occasion, you may experience a mild rash up to 24 hours after the test. This is not dangerous. If this occurs, you can take Benadryl 25 mg and increase your fluid intake. . If you experience trouble breathing, this can be serious. If it is severe call 911 IMMEDIATELY. If it is mild, please call our  office. . If you take any of these medications: Glipizide/Metformin, Avandament, Glucavance, please do not take 48 hours after completing test.-Junamet  Follow-Up: Your physician recommends that you schedule a follow-up appointment in: 4 weeks.  Any Other Special Instructions Will Be Listed Below (If Applicable).     If you need a refill on your cardiac medications before your next appointment, please call your pharmacy.    Angina Pectoris Angina pectoris is a very bad feeling in the chest, neck, or arm. Your doctor may call it angina. There are four types of angina. Angina is caused by a lack of blood in the middle and thickest layer of the heart wall (myocardium). Angina may feel like a crushing or squeezing pain in the chest. It may feel like tightness or heavy pressure in the chest. Some people say it feels like gas, heartburn, or indigestion. Some people have symptoms other than pain. These include:  Shortness of breath.  Cold sweats.  Feeling sick to your stomach (nausea).  Feeling light-headed.  Many women have chest discomfort and some of the other symptoms. However, women often have different symptoms, such as:  Feeling tired (fatigue).  Feeling nervous for no reason.  Feeling weak for no reason.  Dizziness or fainting.  Women may have angina without any symptoms. Follow these instructions at home:  Take medicines only as told by your doctor.  Take care of other health issues as told by your doctor. These include: ? High blood pressure (hypertension). ? Diabetes.  Follow a heart-healthy diet. Your doctor  can help you to choose healthy food options and make changes.  Talk to your doctor to learn more about healthy cooking methods and use them. These include: ? Roasting. ? Grilling. ? Broiling. ? Baking. ? Poaching. ? Steaming. ? Stir-frying.  Follow an exercise program approved by your doctor.  Keep a healthy weight. Lose weight as told by your  doctor.  Rest when you are tired.  Learn to manage stress.  Do not use any tobacco, such as cigarettes, chewing tobacco, or electronic cigarettes. If you need help quitting, ask your doctor.  If you drink alcohol, and your doctor says it is okay, limit yourself to no more than 1 drink per day. One drink equals 12 ounces of beer, 5 ounces of wine, or 1 ounces of hard liquor.  Stop illegal drug use.  Keep all follow-up visits as told by your doctor. This is important. Do not take these medicines unless your doctor says that you can:  Nonsteroidal anti-inflammatory drugs (NSAIDs). These include: ? Ibuprofen. ? Naproxen. ? Celecoxib.  Vitamin supplements that have vitamin A, vitamin E, or both.  Hormone therapy that contains estrogen with or without progestin.  Get help right away if:  You have pain in your chest, neck, arm, jaw, stomach, or back that: ? Lasts more than a few minutes. ? Comes back. ? Does not get better after you take medicine under your tongue (sublingual nitroglycerin).  You have any of these symptoms for no reason: ? Gas, heartburn, or indigestion. ? Sweating a lot. ? Shortness of breath or trouble breathing. ? Feeling sick to your stomach or throwing up. ? Feeling more tired than usual. ? Feeling nervous or worrying more than usual. ? Feeling weak. ? Diarrhea.  You are suddenly dizzy or light-headed.  You faint or pass out. These symptoms may be an emergency. Do not wait to see if the symptoms will go away. Get medical help right away. Call your local emergency services (911 in the [place] Do not drive yourself to the hospital. This information is not intended to replace advice given to you by your health care provider. Make sure you discuss any questions you have with your health care provider. Document Released: 06/28/2007 Document Revised: 06/17/2015 Document Reviewed: 05/13/2013 Elsevier Interactive Patient Education  2017 [employer].

## 2017-04-20 NOTE — Addendum Note (Signed)
Addended by: Warner Mccreedy E on: 04/20/2017 01:49 PM   Modules accepted: Orders

## 2017-04-26 DIAGNOSIS — C439 Malignant melanoma of skin, unspecified: Secondary | ICD-10-CM | POA: Diagnosis not present

## 2017-04-26 DIAGNOSIS — Z79899 Other long term (current) drug therapy: Secondary | ICD-10-CM | POA: Diagnosis not present

## 2017-04-26 DIAGNOSIS — D487 Neoplasm of uncertain behavior of other specified sites: Secondary | ICD-10-CM | POA: Diagnosis not present

## 2017-04-26 DIAGNOSIS — Z7952 Long term (current) use of systemic steroids: Secondary | ICD-10-CM | POA: Diagnosis not present

## 2017-04-26 DIAGNOSIS — Z79891 Long term (current) use of opiate analgesic: Secondary | ICD-10-CM | POA: Diagnosis not present

## 2017-04-26 DIAGNOSIS — Z7982 Long term (current) use of aspirin: Secondary | ICD-10-CM | POA: Diagnosis not present

## 2017-04-26 DIAGNOSIS — C799 Secondary malignant neoplasm of unspecified site: Secondary | ICD-10-CM | POA: Diagnosis not present

## 2017-04-26 DIAGNOSIS — Z7951 Long term (current) use of inhaled steroids: Secondary | ICD-10-CM | POA: Diagnosis not present

## 2017-04-26 DIAGNOSIS — R509 Fever, unspecified: Secondary | ICD-10-CM | POA: Diagnosis not present

## 2017-04-26 DIAGNOSIS — C784 Secondary malignant neoplasm of small intestine: Secondary | ICD-10-CM | POA: Diagnosis not present

## 2017-04-26 DIAGNOSIS — G7000 Myasthenia gravis without (acute) exacerbation: Secondary | ICD-10-CM | POA: Diagnosis not present

## 2017-04-26 DIAGNOSIS — C4359 Malignant melanoma of other part of trunk: Secondary | ICD-10-CM | POA: Diagnosis not present

## 2017-04-26 DIAGNOSIS — D630 Anemia in neoplastic disease: Secondary | ICD-10-CM | POA: Diagnosis not present

## 2017-04-26 DIAGNOSIS — Z7984 Long term (current) use of oral hypoglycemic drugs: Secondary | ICD-10-CM | POA: Diagnosis not present

## 2017-04-26 DIAGNOSIS — C786 Secondary malignant neoplasm of retroperitoneum and peritoneum: Secondary | ICD-10-CM | POA: Diagnosis not present

## 2017-05-01 DIAGNOSIS — C44519 Basal cell carcinoma of skin of other part of trunk: Secondary | ICD-10-CM | POA: Diagnosis not present

## 2017-05-09 DIAGNOSIS — Z7952 Long term (current) use of systemic steroids: Secondary | ICD-10-CM | POA: Diagnosis not present

## 2017-05-09 DIAGNOSIS — M8580 Other specified disorders of bone density and structure, unspecified site: Secondary | ICD-10-CM | POA: Diagnosis not present

## 2017-05-09 DIAGNOSIS — M85852 Other specified disorders of bone density and structure, left thigh: Secondary | ICD-10-CM | POA: Diagnosis not present

## 2017-05-21 ENCOUNTER — Ambulatory Visit: Payer: PPO | Admitting: Cardiology

## 2017-06-06 ENCOUNTER — Encounter (HOSPITAL_COMMUNITY): Payer: Self-pay

## 2017-06-06 ENCOUNTER — Ambulatory Visit (HOSPITAL_COMMUNITY)
Admission: RE | Admit: 2017-06-06 | Discharge: 2017-06-06 | Disposition: A | Discharge status: Home / Self Care | Payer: PPO | Source: Ambulatory Visit | Attending: Cardiology | Admitting: Cardiology

## 2017-06-06 ENCOUNTER — Ambulatory Visit (HOSPITAL_COMMUNITY): Admission: RE | Admit: 2017-06-06 | Payer: PPO | Source: Ambulatory Visit

## 2017-06-06 DIAGNOSIS — R079 Chest pain, unspecified: Secondary | ICD-10-CM | POA: Insufficient documentation

## 2017-06-06 DIAGNOSIS — R9439 Abnormal result of other cardiovascular function study: Secondary | ICD-10-CM | POA: Diagnosis not present

## 2017-06-06 DIAGNOSIS — I2510 Atherosclerotic heart disease of native coronary artery without angina pectoris: Secondary | ICD-10-CM | POA: Diagnosis not present

## 2017-06-06 LAB — POCT I-STAT CREATININE: Creatinine, Ser: 1.1 mg/dL (ref 0.61–1.24)

## 2017-06-06 MED ORDER — NITROGLYCERIN 0.4 MG SL SUBL
0.8000 mg | SUBLINGUAL_TABLET | Freq: Once | SUBLINGUAL | Status: AC
  Administered 2017-06-06: 0.8 mg via SUBLINGUAL
  Filled 2017-06-06: qty 25

## 2017-06-06 MED ORDER — NITROGLYCERIN 0.4 MG SL SUBL
SUBLINGUAL_TABLET | SUBLINGUAL | Status: AC
  Filled 2017-06-06: qty 2

## 2017-06-06 MED ORDER — IOPAMIDOL (ISOVUE-370) INJECTION 76%
INTRAVENOUS | Status: AC
  Administered 2017-06-06: 80 mL via INTRAVENOUS
  Filled 2017-06-06: qty 100

## 2017-06-06 NOTE — Progress Notes (Signed)
CT complete. Patient denies any complaints. Patient eating cookies and drinking coffee.

## 2017-06-10 NOTE — Progress Notes (Deleted)
Cardiology Office Note:    Date:  06/10/2017   ID:  Colin Reyes, DOB 1940/08/28, MRN [id number]  PCP:  Colin Brome, MD  Cardiologist:  Colin More, MD    Referring MD: Colin Brome, MD    ASSESSMENT:    No diagnosis found. PLAN:    In order of problems listed above:  1. ***   Next appointment: ***   Medication Adjustments/Labs and Tests Ordered: Current medicines are reviewed at length with the patient today.  Concerns regarding medicines are outlined above.  No orders of the defined types were placed in this encounter.  No orders of the defined types were placed in this encounter.   No chief complaint on file.   History of Present Illness:    Colin Reyes is a 77 y.o. male with a hx of an abnormal stress echo, myasthenia gravis and a history of metastatic melanoma treated with dabrafenib/trametinib last seen 04/20/16.  ASSESSMENT:    04/20/16   1. Angina pectoris ([place])   2. Abnormal stress echo   3. Metastatic melanoma ([place])   4. Essential hypertension    PLAN:    1. He has typical exertional angina abnormal stress echo he will be treated medically with his aspirin statin intensified beta-blocker and additional nitrates L a cardiac CTA to define the extent and severity of coronary stenosis and reassess back in the office in 1 month. 2. For cardiac CTA to define severity and significance of coronary stenosis 3. Stable managed by oncology he has a good quality of life 4. Stable hypertension blood pressure target continue current treatment 5. Dyslipidemia and diabetes are stable continue current treatment    Compliance with diet, lifestyle and medications: *** Past Medical History:  Diagnosis Date  . Abnormal stress echo 04/19/2017  . Acute respiratory failure ([place]) 09/30/2012  . Anemia 03/30/2016  . Basal cell carcinoma 04/20/2017  . Cancer related pain 07/11/2016  . COPD (chronic obstructive pulmonary disease) ([place]) 12/05/2011  . Diabetes mellitus  ([place])   . Diplopia 12/05/2011  . Dyslipidemia   . Essential hypertension 12/05/2011  . GI bleed 04/20/2017   02/2016   . H/O melanoma excision 09/30/2012  . High risk medication use 06/21/2016  . Hx of umbilical hernia repair 05/26/6501  . Hyperlipidemia 04/20/2017  . Hypertension   . Melanoma ([place]) 2009   Left chest wall   . Melanoma of skin, site unspecified 12/05/2011  . Metastatic melanoma ([place]) 12/05/2011  . Myasthenia gravis ([place]) 2009  . Obesity   . Other general symptoms(780.99) 12/05/2011  . Other specified visual disturbances 12/05/2011  . Pneumonia 09/30/2012  . Psychosocial distress 06/21/2016  . Pulmonary edema 09/30/2012  . 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 Squamous cell carcinoma 04/20/2017  . Type 2 diabetes mellitus ([place]) 09/30/2012  . Type II or unspecified type diabetes mellitus with neurological manifestations, not stated as uncontrolled(250.60) 12/05/2011  . Unspecified ptosis of eyelid 12/05/2011    Past Surgical History:  Procedure Laterality Date  . AXILLARY LYMPH NODE DISSECTION    . BASAL CELL CARCINOMA EXCISION  11/08/12, 11/29/12  . ENTERECTOMY    . LITHOTRIPSY    . MELANOMA EXCISION     x 4 since 2009 (last being in 04/2016 - abdomen/back)  . RENAL ARTERY STENT    . UMBILICAL HERNIA REPAIR  10/27/12    Current Medications: No outpatient medications have been marked as taking for the 06/11/17 encounter (Appointment) with Colin Priest, MD.  Allergies:   Effexor [venlafaxine hcl]   Social History   Socioeconomic History  . Marital status: Widowed    Spouse name: Not on file  . Number of children: 2  . Years of education: College  . Highest education level: Not on file  Occupational History  . Occupation: Retired    Comment: Dance movement psychotherapist  Social Needs  . Financial resource strain: Not on file  . Food insecurity:    Worry: Not on file    Inability: Not on file  . Transportation needs:    Medical: Not on file     Non-medical: Not on file  Tobacco Use  . Smoking status: Former Smoker    Last attempt to quit: 01/23/1990    Years since quitting: 27.3  . Smokeless tobacco: Never Used  Substance and Sexual Activity  . Alcohol use: No    Alcohol/week: 0.0 oz  . Drug use: No  . Sexual activity: Never  Lifestyle  . Physical activity:    Days per week: Not on file    Minutes per session: Not on file  . Stress: Not on file  Relationships  . Social connections:    Talks on phone: Not on file    Gets together: Not on file    Attends religious service: Not on file    Active member of club or organization: Not on file    Attends meetings of clubs or organizations: Not on file    Relationship status: Not on file  Other Topics Concern  . Not on file  Social History Narrative   Patient is right handed.   Patient drinks about 4 cups of coffee a day.   Patient at home      Family History: The patient's ***family history includes Cancer - Lung in his father; Cancer - Prostate in his brother; Hypertension in his mother. ROS:   Please see the history of present illness.    All other systems reviewed and are negative.  EKGs/Labs/Other Studies Reviewed:    The following studies were reviewed today:  EKG:  EKG ordered today.  The ekg ordered today demonstrates *** Cardiac CTA 06/06/17: IMPRESSION: 1. Coronary artery calcium score 1373 Agatston units, placing the patient in the 81st percentile for age and gender, suggesting high risk for future cardiac events. 2.  Dominant LCx, suspect mild stenosis proximally. 3. Dense calcification of the proximal and mid LAD. Given blooming artifact, cannot rule out hemodynamically significant stenosis. Will send for CT FFR. Recent Labs: 06/06/2017: Creatinine, Ser 1.10  Recent Lipid Panel No results found for: CHOL, TRIG, HDL, CHOLHDL, VLDL, LDLCALC, LDLDIRECT  Physical Exam:    VS:  There were no vitals taken for this visit.    Wt Readings from Last 3  Encounters:  04/20/17 187 lb (84.8 kg)  12/29/16 183 lb 8 oz (83.2 kg)  08/24/16 171 lb 8 oz (77.8 kg)     GEN: *** Well nourished, well developed in no acute distress HEENT: Normal NECK: No JVD; No carotid bruits LYMPHATICS: No lymphadenopathy CARDIAC: ***RRR, no murmurs, rubs, gallops RESPIRATORY:  Clear to auscultation without rales, wheezing or rhonchi  ABDOMEN: Soft, non-tender, non-distended MUSCULOSKELETAL:  No edema; No deformity  SKIN: Warm and dry NEUROLOGIC:  Alert and oriented x 3 PSYCHIATRIC:  Normal affect    Signed, Colin More, MD  06/10/2017 9:29 PM    [place]

## 2017-06-11 ENCOUNTER — Ambulatory Visit: Payer: PPO | Admitting: Cardiology

## 2017-06-13 ENCOUNTER — Ambulatory Visit (HOSPITAL_COMMUNITY)
Admission: RE | Admit: 2017-06-13 | Discharge: 2017-06-13 | Disposition: A | Discharge status: Home / Self Care | Payer: PPO | Source: Ambulatory Visit | Attending: Cardiology | Admitting: Cardiology

## 2017-06-13 DIAGNOSIS — R079 Chest pain, unspecified: Secondary | ICD-10-CM | POA: Diagnosis not present

## 2017-06-13 DIAGNOSIS — R9439 Abnormal result of other cardiovascular function study: Secondary | ICD-10-CM | POA: Diagnosis not present

## 2017-06-14 ENCOUNTER — Encounter: Payer: Self-pay | Admitting: Cardiology

## 2017-06-14 ENCOUNTER — Ambulatory Visit (INDEPENDENT_AMBULATORY_CARE_PROVIDER_SITE_OTHER): Payer: PPO | Admitting: Cardiology

## 2017-06-14 VITALS — BP 122/56 | HR 79 | Ht 69.0 in | Wt 180.0 lb

## 2017-06-14 DIAGNOSIS — I10 Essential (primary) hypertension: Secondary | ICD-10-CM | POA: Diagnosis not present

## 2017-06-14 DIAGNOSIS — I25118 Atherosclerotic heart disease of native coronary artery with other forms of angina pectoris: Secondary | ICD-10-CM

## 2017-06-14 DIAGNOSIS — E785 Hyperlipidemia, unspecified: Secondary | ICD-10-CM

## 2017-06-14 DIAGNOSIS — I2510 Atherosclerotic heart disease of native coronary artery without angina pectoris: Secondary | ICD-10-CM | POA: Insufficient documentation

## 2017-06-14 NOTE — Patient Instructions (Signed)
Medication Instructions:  Your physician recommends that you continue on your current medications as directed. Please refer to the Current Medication list given to you today.   Labwork: None  Testing/Procedures: None  Follow-Up: Your physician wants you to follow-up in: 6 months. You will receive a reminder letter in the mail two months in advance. If you don't receive a letter, please call our office to schedule the follow-up appointment.   If you need a refill on your cardiac medications before your next appointment, please call your pharmacy.   Thank you for choosing [facility]! Mushka Laconte, RN [phone]    

## 2017-06-14 NOTE — Progress Notes (Signed)
Cardiology Office Note:    Date:  06/20/2017   ID:  Colin Reyes, DOB 04/21/1940, MRN [id number]  PCP:  Rochel Brome, MD  Cardiologist:  Shirlee More, MD    Referring MD: Rochel Brome, MD    ASSESSMENT:    1. Coronary artery disease of native artery of native heart with stable angina pectoris ([place])    PLAN:    In order of problems listed above:  1. Stable continue medical therapy. His T2 DM, hypertension and hyperlipidemia are stable and continue current treatment.   FU 6 months   Medication Adjustments/Labs and Tests Ordered: Current medicines are reviewed at length with the patient today.  Concerns regarding medicines are outlined above.  No orders of the defined types were placed in this encounter.  No orders of the defined types were placed in this encounter.   Chief Complaint  Patient presents with  . Follow-up  . Coronary Artery Disease    History of Present Illness:    Colin Reyes is a 77 y.o. male with a hx of chest pain last seen 04/20/17.  ASSESSMENT:    06/14/17   1. Angina pectoris ([place])   2. Abnormal stress echo   3. Metastatic melanoma ([place])   4. Essential hypertension    PLAN:    1. He has typical exertional angina abnormal stress echo he will be treated medically with his aspirin statin intensified beta-blocker and additional nitrates L a cardiac CTA to define the extent and severity of coronary stenosis and reassess back in the office in 1 month. 2. For cardiac CTA to define severity and significance of coronary stenosis 3. Stable managed by oncology he has a good quality of life 4. Stable hypertension blood pressure target continue current treatment 5. Dyslipidemia and diabetes are stable continue current treatment  Compliance with diet, lifestyle and medications: Yes Past Medical History:  Diagnosis Date  . Abnormal stress echo 04/19/2017  . Acute respiratory failure ([place]) 09/30/2012  . Anemia 03/30/2016  . Basal cell carcinoma  04/20/2017  . Cancer related pain 07/11/2016  . COPD (chronic obstructive pulmonary disease) ([place]) 12/05/2011  . Diabetes mellitus ([place])   . Diplopia 12/05/2011  . Dyslipidemia   . Essential hypertension 12/05/2011  . GI bleed 04/20/2017   02/2016   . H/O melanoma excision 09/30/2012  . High risk medication use 06/21/2016  . Hx of umbilical hernia repair 05/26/6466  . Hyperlipidemia 04/20/2017  . Hypertension   . Melanoma ([place]) 2009   Left chest wall   . Melanoma of skin, site unspecified 12/05/2011  . Metastatic melanoma ([place]) 12/05/2011  . Myasthenia gravis ([place]) 2009  . Obesity   . Other general symptoms(780.99) 12/05/2011  . Other specified visual disturbances 12/05/2011  . Pneumonia 09/30/2012  . Psychosocial distress 06/21/2016  . Pulmonary edema 09/30/2012  . 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 Squamous cell carcinoma 04/20/2017  . Type 2 diabetes mellitus ([place]) 09/30/2012  . Type II or unspecified type diabetes mellitus with neurological manifestations, not stated as uncontrolled(250.60) 12/05/2011  . Unspecified ptosis of eyelid 12/05/2011    Past Surgical History:  Procedure Laterality Date  . AXILLARY LYMPH NODE DISSECTION    . BASAL CELL CARCINOMA EXCISION  11/08/12, 11/29/12  . ENTERECTOMY    . LITHOTRIPSY    . MELANOMA EXCISION     x 4 since 2009 (last being in 04/2016 - abdomen/back)  . RENAL ARTERY STENT    . UMBILICAL HERNIA REPAIR  10/27/12  Current Medications: Current Meds  Medication Sig  . ADVAIR DISKUS 250-50 MCG/DOSE AEPB Inhale 250 puffs into the lungs 2 (two) times daily.  Marland Kitchen alendronate (FOSAMAX) 10 MG tablet Take 10 mg by mouth once a week. Take with a full glass of water on an empty stomach.  Marland Kitchen aspirin EC 81 MG tablet Take 81 mg by mouth daily.  . AVODART 0.5 MG capsule Take 0.5 mg by mouth daily.  . Calcium Carbonate (CALCIUM 600 PO) Take 1 tablet by mouth daily.  . Cholecalciferol (VITAMIN D) 2000 UNITS CAPS Take 2,000 Units by mouth daily.   Marland Kitchen dabrafenib mesylate (TAFINLAR) 75 MG capsule Take 75 mg by mouth 2 (two) times daily. Take on an empty stomach 1 hour before or 2 hours after meals.  Marland Kitchen glipiZIDE (GLUCOTROL XL) 10 MG 24 hr tablet Take 10 mg by mouth 2 (two) times daily.  . isosorbide mononitrate (IMDUR) 30 MG 24 hr tablet Take 1 tablet (30 mg total) by mouth daily.  Marland Kitchen losartan (COZAAR) 25 MG tablet Take 25 mg by mouth 2 (two) times daily.   . Magnesium 250 MG TABS Take 1 tablet by mouth daily.  . metoprolol succinate (TOPROL-XL) 50 MG 24 hr tablet Take 1 tablet (50 mg total) by mouth daily. Take with or immediately following a meal.  . ONE TOUCH ULTRA TEST test strip   . predniSONE (DELTASONE) 1 MG tablet Take 2 mg by mouth daily with breakfast.   . predniSONE (DELTASONE) 5 MG tablet Take 5 mg by mouth daily with breakfast.   . Probiotic Product (PROBIOTIC DAILY PO) Take by mouth daily.  Marland Kitchen pyridostigmine (MESTINON) 60 MG tablet Take 1 tablet (60 mg total) by mouth 4 (four) times daily.  . simvastatin (ZOCOR) 20 MG tablet Take 20 mg by mouth daily.  . SitaGLIPtin-MetFORMIN HCl (JANUMET XR) 50-1000 MG TB24 Take 2 tablets by mouth daily.  . traMADol (ULTRAM) 50 MG tablet Take 50 mg by mouth 2 (two) times daily.  . trametinib dimethyl sulfoxide (MEKINIST) 0.5 MG tablet Take 1 mg by mouth 2 (two) times daily. Take 1 hour before or 2 hours after a meal. Store refrigerated in original container.      Allergies:   Effexor [venlafaxine hcl]   Social History   Socioeconomic History  . Marital status: Widowed    Spouse name: Not on file  . Number of children: 2  . Years of education: College  . Highest education level: Not on file  Occupational History  . Occupation: Retired    Comment: Dance movement psychotherapist  Social Needs  . Financial resource strain: Not on file  . Food insecurity:    Worry: Not on file    Inability: Not on file  . Transportation needs:    Medical: Not on file    Non-medical: Not on file  Tobacco Use    . Smoking status: Former Smoker    Last attempt to quit: 01/23/1990    Years since quitting: 27.4  . Smokeless tobacco: Never Used  Substance and Sexual Activity  . Alcohol use: No    Alcohol/week: 0.0 oz  . Drug use: No  . Sexual activity: Never  Lifestyle  . Physical activity:    Days per week: Not on file    Minutes per session: Not on file  . Stress: Not on file  Relationships  . Social connections:    Talks on phone: Not on file    Gets together: Not on file  Attends religious service: Not on file    Active member of club or organization: Not on file    Attends meetings of clubs or organizations: Not on file    Relationship status: Not on file  Other Topics Concern  . Not on file  Social History Narrative   Patient is right handed.   Patient drinks about 4 cups of coffee a day.   Patient at home      Family History: The patient's family history includes Cancer - Lung in his father; Cancer - Prostate in his brother; Hypertension in his mother. ROS:   Please see the history of present illness.    All other systems reviewed and are negative.  EKGs/Labs/Other Studies Reviewed:    The following studies were reviewed today:   CTA ; IMPRESSION: 1. Coronary artery calcium score 1373 Agatston units, placing the patient in the 81st percentile for age and gender, suggesting high risk for future cardiac events. 2.  Dominant LCx, suspect mild stenosis proximally. 3. Dense calcification of the proximal and mid LAD. Given blooming artifact, cannot rule out hemodynamically significant stenosis. Will send for CT FFR. Unable to do FFR Recent Labs: 06/06/2017: Creatinine, Ser 1.10  Recent Lipid Panel No results found for: CHOL, TRIG, HDL, CHOLHDL, VLDL, LDLCALC, LDLDIRECT  Physical Exam:    VS:  BP (!) 122/56 (BP Location: Right Arm, Patient Position: Sitting, Cuff Size: Normal)   Pulse 79   Ht 5\' 9"  (1.753 m)   Wt 180 lb (81.6 kg)   SpO2 98%   BMI 26.58 kg/m      Wt Readings from Last 3 Encounters:  06/14/17 180 lb (81.6 kg)  04/20/17 187 lb (84.8 kg)  12/29/16 183 lb 8 oz (83.2 kg)     GEN:  Well nourished, well developed in no acute distress HEENT: Normal NECK: No JVD; No carotid bruits LYMPHATICS: No lymphadenopathy CARDIAC: RRR, no murmurs, rubs, gallops RESPIRATORY:  Clear to auscultation without rales, wheezing or rhonchi  ABDOMEN: Soft, non-tender, non-distended MUSCULOSKELETAL:  No edema; No deformity  SKIN: Warm and dry NEUROLOGIC:  Alert and oriented x 3 PSYCHIATRIC:  Normal affect    Signed, Shirlee More, MD  06/20/2017 9:16 AM    [place]

## 2017-06-19 DIAGNOSIS — R202 Paresthesia of skin: Secondary | ICD-10-CM | POA: Diagnosis not present

## 2017-06-19 DIAGNOSIS — G936 Cerebral edema: Secondary | ICD-10-CM | POA: Diagnosis not present

## 2017-06-19 DIAGNOSIS — J9811 Atelectasis: Secondary | ICD-10-CM | POA: Diagnosis not present

## 2017-06-19 DIAGNOSIS — R531 Weakness: Secondary | ICD-10-CM | POA: Diagnosis not present

## 2017-06-19 DIAGNOSIS — R200 Anesthesia of skin: Secondary | ICD-10-CM | POA: Diagnosis not present

## 2017-06-20 DIAGNOSIS — R531 Weakness: Secondary | ICD-10-CM | POA: Diagnosis not present

## 2017-06-20 DIAGNOSIS — Z7983 Long term (current) use of bisphosphonates: Secondary | ICD-10-CM | POA: Diagnosis not present

## 2017-06-20 DIAGNOSIS — E119 Type 2 diabetes mellitus without complications: Secondary | ICD-10-CM | POA: Diagnosis not present

## 2017-06-20 DIAGNOSIS — Z7982 Long term (current) use of aspirin: Secondary | ICD-10-CM | POA: Diagnosis not present

## 2017-06-20 DIAGNOSIS — Z7951 Long term (current) use of inhaled steroids: Secondary | ICD-10-CM | POA: Diagnosis not present

## 2017-06-20 DIAGNOSIS — Z5181 Encounter for therapeutic drug level monitoring: Secondary | ICD-10-CM | POA: Diagnosis not present

## 2017-06-20 DIAGNOSIS — Z85820 Personal history of malignant melanoma of skin: Secondary | ICD-10-CM | POA: Diagnosis not present

## 2017-06-20 DIAGNOSIS — Z7984 Long term (current) use of oral hypoglycemic drugs: Secondary | ICD-10-CM | POA: Diagnosis not present

## 2017-06-20 DIAGNOSIS — C439 Malignant melanoma of skin, unspecified: Secondary | ICD-10-CM | POA: Diagnosis not present

## 2017-06-20 DIAGNOSIS — Z9221 Personal history of antineoplastic chemotherapy: Secondary | ICD-10-CM | POA: Diagnosis not present

## 2017-06-20 DIAGNOSIS — R569 Unspecified convulsions: Secondary | ICD-10-CM | POA: Diagnosis not present

## 2017-06-20 DIAGNOSIS — I10 Essential (primary) hypertension: Secondary | ICD-10-CM | POA: Diagnosis not present

## 2017-06-20 DIAGNOSIS — G936 Cerebral edema: Secondary | ICD-10-CM | POA: Diagnosis not present

## 2017-06-20 DIAGNOSIS — G939 Disorder of brain, unspecified: Secondary | ICD-10-CM | POA: Diagnosis not present

## 2017-06-20 DIAGNOSIS — Z886 Allergy status to analgesic agent status: Secondary | ICD-10-CM | POA: Diagnosis not present

## 2017-06-20 DIAGNOSIS — D496 Neoplasm of unspecified behavior of brain: Secondary | ICD-10-CM | POA: Diagnosis not present

## 2017-06-20 DIAGNOSIS — R200 Anesthesia of skin: Secondary | ICD-10-CM | POA: Diagnosis not present

## 2017-06-20 DIAGNOSIS — Z885 Allergy status to narcotic agent status: Secondary | ICD-10-CM | POA: Diagnosis not present

## 2017-06-20 DIAGNOSIS — R931 Abnormal findings on diagnostic imaging of heart and coronary circulation: Secondary | ICD-10-CM | POA: Diagnosis not present

## 2017-06-20 DIAGNOSIS — G7000 Myasthenia gravis without (acute) exacerbation: Secondary | ICD-10-CM | POA: Diagnosis not present

## 2017-06-20 DIAGNOSIS — R202 Paresthesia of skin: Secondary | ICD-10-CM | POA: Diagnosis not present

## 2017-06-20 DIAGNOSIS — J449 Chronic obstructive pulmonary disease, unspecified: Secondary | ICD-10-CM | POA: Diagnosis not present

## 2017-06-20 DIAGNOSIS — C719 Malignant neoplasm of brain, unspecified: Secondary | ICD-10-CM | POA: Diagnosis not present

## 2017-06-20 DIAGNOSIS — C7931 Secondary malignant neoplasm of brain: Secondary | ICD-10-CM | POA: Diagnosis not present

## 2017-06-20 DIAGNOSIS — Z87891 Personal history of nicotine dependence: Secondary | ICD-10-CM | POA: Diagnosis not present

## 2017-06-20 DIAGNOSIS — M47812 Spondylosis without myelopathy or radiculopathy, cervical region: Secondary | ICD-10-CM | POA: Diagnosis not present

## 2017-06-25 DIAGNOSIS — C438 Malignant melanoma of overlapping sites of skin: Secondary | ICD-10-CM | POA: Diagnosis not present

## 2017-06-25 DIAGNOSIS — C7931 Secondary malignant neoplasm of brain: Secondary | ICD-10-CM | POA: Diagnosis not present

## 2017-06-25 DIAGNOSIS — C439 Malignant melanoma of skin, unspecified: Secondary | ICD-10-CM | POA: Diagnosis not present

## 2017-06-29 ENCOUNTER — Encounter: Payer: Self-pay | Admitting: Neurology

## 2017-06-29 ENCOUNTER — Other Ambulatory Visit: Payer: Self-pay

## 2017-06-29 ENCOUNTER — Ambulatory Visit (INDEPENDENT_AMBULATORY_CARE_PROVIDER_SITE_OTHER): Payer: PPO | Admitting: Neurology

## 2017-06-29 VITALS — BP 133/67 | HR 61 | Wt 179.5 lb

## 2017-06-29 DIAGNOSIS — G7000 Myasthenia gravis without (acute) exacerbation: Secondary | ICD-10-CM

## 2017-06-29 MED ORDER — PYRIDOSTIGMINE BROMIDE 60 MG PO TABS: 60.0000 mg | ORAL_TABLET | Freq: Four times a day (QID) | ORAL | 3 refills | Status: AC

## 2017-06-29 NOTE — Progress Notes (Signed)
Reason for visit: Myasthenia gravis, seizures  Colin Reyes is an 77 y.o. male  History of present illness:  Mr. Zunker is a 77 year old right-handed white male with a history of myasthenia gravis and a history of metastatic melanoma.  The patient has done relatively well with his myasthenia, he reports no troubles with double vision or ptosis of the eyelids or significant troubles with chewing or swallowing.  He denies any weakness of the extremities.  He has had a poor appetite recently.  He had a seizure event on 20 Jun 2017.  The seizure was a focal seizure, primarily sensory in nature.  The event started with pain involving the right hand and his hand was difficult to control.  The sensory alteration spread slowly up the right arm to the shoulder with a tingling sensation as well.  The entire event lasted several minutes and then cleared without any residual.  The patient went to the emergency room at [facility] and MRI of the brain revealed evidence of a metastatic lesion in the left parietal area.  The patient has been set up for gamma knife radiation therapy that will be done in the near future.  He was taken off of his prednisone, he was on a 7 mg dose.  He was placed on 4 mg of Decadron.  He remains on Mestinon taking 60 mg 4 times daily.  He is now on Keppra for his seizures taking 500 mg twice daily.  He has also had episodes of chest pain, he underwent a cardiac evaluation, he was found to have some coronary artery disease but he did not require a stent.  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  Past Medical History:  Diagnosis Date  . Abnormal stress echo 04/19/2017  . Acute respiratory failure ([place]) 09/30/2012  . Anemia 03/30/2016  . Basal cell carcinoma 04/20/2017  . Cancer related pain 07/11/2016  . COPD (chronic obstructive pulmonary disease) ([place]) 12/05/2011  . Diabetes mellitus ([place])   . Diplopia 12/05/2011  .  Dyslipidemia   . Essential hypertension 12/05/2011  . GI bleed 04/20/2017   02/2016   . H/O melanoma excision 09/30/2012  . High risk medication use 06/21/2016  . Hx of umbilical hernia repair 05/23/256  . Hyperlipidemia 04/20/2017  . Hypertension   . Melanoma ([place]) 2009   Left chest wall   . Melanoma of skin, site unspecified 12/05/2011  . Metastatic melanoma ([place]) 12/05/2011  . Myasthenia gravis ([place]) 2009  . Obesity   . Other general symptoms(780.99) 12/05/2011  . Other specified visual disturbances 12/05/2011  . Pneumonia 09/30/2012  . Psychosocial distress 06/21/2016  . Pulmonary edema 09/30/2012  . 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 Squamous cell carcinoma 04/20/2017  . Type 2 diabetes mellitus ([place]) 09/30/2012  . Type II or unspecified type diabetes mellitus with neurological manifestations, not stated as uncontrolled(250.60) 12/05/2011  . Unspecified ptosis of eyelid 12/05/2011    Past Surgical History:  Procedure Laterality Date  . AXILLARY LYMPH NODE DISSECTION    . BASAL CELL CARCINOMA EXCISION  11/08/12, 11/29/12  . ENTERECTOMY    . LITHOTRIPSY    . MELANOMA EXCISION     x 4 since 2009 (last being in 04/2016 - abdomen/back)  . RENAL ARTERY STENT    . UMBILICAL HERNIA REPAIR  10/27/12    Family History  Problem Relation Age of Onset  . Cancer - Lung Father   .  Hypertension Mother   . Cancer - Prostate Brother     Social history:  reports that he quit smoking about 27 years ago. He has never used smokeless tobacco. He reports that he does not drink alcohol or use drugs.    Allergies  Allergen Reactions  . Effexor [Venlafaxine Hcl]     Difficulty swallowing and chewing    Medications:  Prior to Admission medications   Medication Sig Start Date End Date Taking? Authorizing Provider  ADVAIR DISKUS 250-50 MCG/DOSE AEPB Inhale 250 puffs into the lungs 2 (two) times daily. 05/29/12  Yes [provider]  aspirin EC 81 MG tablet Take 81 mg by mouth  daily.   Yes [provider]  AVODART 0.5 MG capsule Take 0.5 mg by mouth daily. 05/16/12  Yes [provider]  Calcium Carbonate (CALCIUM 600 PO) Take 1 tablet by mouth daily.   Yes [provider]  Cholecalciferol (VITAMIN D) 2000 UNITS CAPS Take 2,000 Units by mouth daily.   Yes [provider]  dabrafenib mesylate (TAFINLAR) 75 MG capsule Take 75 mg by mouth 2 (two) times daily. Take on an empty stomach 1 hour before or 2 hours after meals.   Yes [provider]  dexamethasone (DECADRON) 2 MG tablet Take 2 mg by mouth 2 (two) times daily with a meal.   Yes [provider]  glipiZIDE (GLUCOTROL XL) 10 MG 24 hr tablet Take 10 mg by mouth 2 (two) times daily. 05/25/12  Yes [provider]  isosorbide mononitrate (IMDUR) 30 MG 24 hr tablet Take 1 tablet (30 mg total) by mouth daily. 04/20/17 07/19/17 Yes Richardo Priest, MD  levETIRAcetam (KEPPRA) 500 MG tablet Take 500 mg by mouth 2 (two) times daily.   Yes [provider]  losartan (COZAAR) 25 MG tablet Take 25 mg by mouth 2 (two) times daily.  06/03/12  Yes [provider]  Magnesium 250 MG TABS Take 1 tablet by mouth daily.   Yes [provider]  metoprolol succinate (TOPROL-XL) 50 MG 24 hr tablet Take 1 tablet (50 mg total) by mouth daily. Take with or immediately following a meal. 04/20/17 07/19/17 Yes Munley, Hilton Cork, MD  Probiotic Product (PROBIOTIC DAILY PO) Take by mouth daily.   Yes [provider]  pyridostigmine (MESTINON) 60 MG tablet Take 1 tablet (60 mg total) by mouth 4 (four) times daily. 06/29/17  Yes Kathrynn Ducking, MD  ranitidine (ZANTAC) 150 MG tablet Take 150 mg by mouth 2 (two) times daily.   Yes [provider]  simvastatin (ZOCOR) 20 MG tablet Take 20 mg by mouth daily. 05/20/12  Yes [provider]  SitaGLIPtin-MetFORMIN HCl (JANUMET XR) 50-1000 MG TB24 Take 2 tablets by mouth daily.   Yes [provider]    traMADol (ULTRAM) 50 MG tablet Take 50 mg by mouth 2 (two) times daily.   Yes [provider]  trametinib dimethyl sulfoxide (MEKINIST) 0.5 MG tablet Take 1 mg by mouth 2 (two) times daily. Take 1 hour before or 2 hours after a meal. Store refrigerated in original container.    Yes [provider]  alendronate (FOSAMAX) 10 MG tablet Take 10 mg by mouth once a week. Take with a full glass of water on an empty stomach.    [provider]  ONE TOUCH ULTRA TEST test strip  06/12/12   [provider]  predniSONE (DELTASONE) 1 MG tablet Take 2 mg by mouth daily with breakfast.  [provider]  predniSONE (DELTASONE) 5 MG tablet Take 5 mg by mouth daily with breakfast.     [provider]    ROS:  Out of a complete 14 system review of symptoms, the patient complains only of the following symptoms, and all other reviewed systems are negative.  Bruising easily Seizure Back pain, neck pain Chest pain, leg swelling  Blood pressure 133/67, pulse 61, weight 179 lb 8 oz (81.4 kg).  Physical Exam  General: The patient is alert and cooperative at the time of the examination.  Skin: No significant peripheral edema is noted.   Neurologic Exam  Mental status: The patient is alert and oriented x 3 at the time of the examination. The patient has apparent normal recent and remote memory, with an apparently normal attention span and concentration ability.   Cranial nerves: Facial symmetry is present. Speech is normal, no aphasia or dysarthria is noted. Extraocular movements are full. Visual fields are full.  With superior gaze for 1 minute, no divergence of gaze, ptosis or subjective double vision was noted.  Motor: The patient has good strength in all 4 extremities.  With arms outstretched for 1 minute, no fatigable weakness of the deltoid muscles was noted.  Sensory examination: Soft touch sensation is symmetric on the face, arms, and  legs.  Coordination: The patient has good finger-nose-finger and heel-to-shin bilaterally.  Gait and station: The patient has a normal gait. Tandem gait is slightly unsteady. Romberg is negative. No drift is seen.  Reflexes: Deep tendon reflexes are symmetric.   MRI brain 06/20/17:  2.1 cm enhancing mass in the left parietal lobe with perilesional edema, most likely representing a metastasis. -- Probable right internal carotid artery high-grade stenosis/occlusion, nonacute.  Assessment/Plan:  1.  Myasthenia gravis  2.  Metastatic melanoma  3.  Right arm focal sensory seizure, left parietal metastatic melanoma  The patient is now on Keppra, he has been placed on Decadron in the prednisone has been discontinued.  The patient was given a prescription for the Mestinon.  He will follow-up in 6 months.  He is being followed through [facility] for his seizures and for his melanoma.  Jill Alexanders MD 06/29/2017 9:22 AM  [facility] [address] [place], [place] [address]  Phone [phone] Fax [phone]

## 2017-07-03 DIAGNOSIS — C438 Malignant melanoma of overlapping sites of skin: Secondary | ICD-10-CM | POA: Diagnosis not present

## 2017-07-03 DIAGNOSIS — C439 Malignant melanoma of skin, unspecified: Secondary | ICD-10-CM | POA: Diagnosis not present

## 2017-07-03 DIAGNOSIS — C7931 Secondary malignant neoplasm of brain: Secondary | ICD-10-CM | POA: Diagnosis not present

## 2017-07-03 DIAGNOSIS — G7000 Myasthenia gravis without (acute) exacerbation: Secondary | ICD-10-CM | POA: Diagnosis not present

## 2017-07-03 DIAGNOSIS — Z79899 Other long term (current) drug therapy: Secondary | ICD-10-CM | POA: Diagnosis not present

## 2017-07-04 DIAGNOSIS — C439 Malignant melanoma of skin, unspecified: Secondary | ICD-10-CM | POA: Diagnosis not present

## 2017-07-04 DIAGNOSIS — C7931 Secondary malignant neoplasm of brain: Secondary | ICD-10-CM | POA: Diagnosis not present

## 2017-07-04 DIAGNOSIS — C438 Malignant melanoma of overlapping sites of skin: Secondary | ICD-10-CM | POA: Diagnosis not present

## 2017-07-10 DIAGNOSIS — R482 Apraxia: Secondary | ICD-10-CM | POA: Diagnosis not present

## 2017-07-10 DIAGNOSIS — E782 Mixed hyperlipidemia: Secondary | ICD-10-CM | POA: Diagnosis not present

## 2017-07-10 DIAGNOSIS — C7931 Secondary malignant neoplasm of brain: Secondary | ICD-10-CM | POA: Diagnosis not present

## 2017-07-10 DIAGNOSIS — G7000 Myasthenia gravis without (acute) exacerbation: Secondary | ICD-10-CM | POA: Diagnosis not present

## 2017-07-10 DIAGNOSIS — E1121 Type 2 diabetes mellitus with diabetic nephropathy: Secondary | ICD-10-CM | POA: Diagnosis not present

## 2017-07-10 DIAGNOSIS — C784 Secondary malignant neoplasm of small intestine: Secondary | ICD-10-CM | POA: Diagnosis not present

## 2017-07-10 DIAGNOSIS — I10 Essential (primary) hypertension: Secondary | ICD-10-CM | POA: Diagnosis not present

## 2017-07-10 DIAGNOSIS — E1142 Type 2 diabetes mellitus with diabetic polyneuropathy: Secondary | ICD-10-CM | POA: Diagnosis not present

## 2017-07-10 DIAGNOSIS — C4359 Malignant melanoma of other part of trunk: Secondary | ICD-10-CM | POA: Diagnosis not present

## 2017-07-10 DIAGNOSIS — G936 Cerebral edema: Secondary | ICD-10-CM | POA: Diagnosis not present

## 2017-07-11 DIAGNOSIS — R4781 Slurred speech: Secondary | ICD-10-CM | POA: Diagnosis not present

## 2017-07-11 DIAGNOSIS — R29810 Facial weakness: Secondary | ICD-10-CM | POA: Diagnosis not present

## 2017-07-11 DIAGNOSIS — R410 Disorientation, unspecified: Secondary | ICD-10-CM | POA: Diagnosis not present

## 2017-07-11 DIAGNOSIS — E162 Hypoglycemia, unspecified: Secondary | ICD-10-CM | POA: Diagnosis not present

## 2017-07-11 DIAGNOSIS — E161 Other hypoglycemia: Secondary | ICD-10-CM | POA: Diagnosis not present

## 2017-07-31 DIAGNOSIS — C7931 Secondary malignant neoplasm of brain: Secondary | ICD-10-CM | POA: Diagnosis not present

## 2017-07-31 DIAGNOSIS — C4359 Malignant melanoma of other part of trunk: Secondary | ICD-10-CM | POA: Diagnosis not present

## 2017-07-31 DIAGNOSIS — C799 Secondary malignant neoplasm of unspecified site: Secondary | ICD-10-CM | POA: Diagnosis not present

## 2017-07-31 DIAGNOSIS — C439 Malignant melanoma of skin, unspecified: Secondary | ICD-10-CM | POA: Diagnosis not present

## 2017-07-31 DIAGNOSIS — E119 Type 2 diabetes mellitus without complications: Secondary | ICD-10-CM | POA: Diagnosis not present

## 2017-07-31 DIAGNOSIS — G7000 Myasthenia gravis without (acute) exacerbation: Secondary | ICD-10-CM | POA: Diagnosis not present

## 2017-07-31 DIAGNOSIS — Z79899 Other long term (current) drug therapy: Secondary | ICD-10-CM | POA: Diagnosis not present

## 2017-08-18 DIAGNOSIS — I619 Nontraumatic intracerebral hemorrhage, unspecified: Secondary | ICD-10-CM | POA: Diagnosis not present

## 2017-08-18 DIAGNOSIS — Z7982 Long term (current) use of aspirin: Secondary | ICD-10-CM | POA: Diagnosis not present

## 2017-08-18 DIAGNOSIS — R279 Unspecified lack of coordination: Secondary | ICD-10-CM | POA: Diagnosis not present

## 2017-08-18 DIAGNOSIS — C439 Malignant melanoma of skin, unspecified: Secondary | ICD-10-CM | POA: Diagnosis not present

## 2017-08-18 DIAGNOSIS — E7800 Pure hypercholesterolemia, unspecified: Secondary | ICD-10-CM | POA: Diagnosis not present

## 2017-08-18 DIAGNOSIS — R251 Tremor, unspecified: Secondary | ICD-10-CM | POA: Diagnosis not present

## 2017-08-18 DIAGNOSIS — R4701 Aphasia: Secondary | ICD-10-CM | POA: Diagnosis not present

## 2017-08-18 DIAGNOSIS — G40009 Localization-related (focal) (partial) idiopathic epilepsy and epileptic syndromes with seizures of localized onset, not intractable, without status epilepticus: Secondary | ICD-10-CM | POA: Diagnosis not present

## 2017-08-18 DIAGNOSIS — N400 Enlarged prostate without lower urinary tract symptoms: Secondary | ICD-10-CM | POA: Diagnosis not present

## 2017-08-18 DIAGNOSIS — G7000 Myasthenia gravis without (acute) exacerbation: Secondary | ICD-10-CM | POA: Diagnosis not present

## 2017-08-18 DIAGNOSIS — E785 Hyperlipidemia, unspecified: Secondary | ICD-10-CM | POA: Diagnosis not present

## 2017-08-18 DIAGNOSIS — G40109 Localization-related (focal) (partial) symptomatic epilepsy and epileptic syndromes with simple partial seizures, not intractable, without status epilepticus: Secondary | ICD-10-CM | POA: Diagnosis not present

## 2017-08-18 DIAGNOSIS — Z7984 Long term (current) use of oral hypoglycemic drugs: Secondary | ICD-10-CM | POA: Diagnosis not present

## 2017-08-18 DIAGNOSIS — E119 Type 2 diabetes mellitus without complications: Secondary | ICD-10-CM | POA: Diagnosis not present

## 2017-08-18 DIAGNOSIS — C7931 Secondary malignant neoplasm of brain: Secondary | ICD-10-CM | POA: Diagnosis not present

## 2017-08-18 DIAGNOSIS — R079 Chest pain, unspecified: Secondary | ICD-10-CM | POA: Diagnosis not present

## 2017-08-18 DIAGNOSIS — G9389 Other specified disorders of brain: Secondary | ICD-10-CM | POA: Diagnosis not present

## 2017-08-18 DIAGNOSIS — R278 Other lack of coordination: Secondary | ICD-10-CM | POA: Diagnosis not present

## 2017-08-18 DIAGNOSIS — Z7951 Long term (current) use of inhaled steroids: Secondary | ICD-10-CM | POA: Diagnosis not present

## 2017-08-18 DIAGNOSIS — Z743 Need for continuous supervision: Secondary | ICD-10-CM | POA: Diagnosis not present

## 2017-08-18 DIAGNOSIS — Z87891 Personal history of nicotine dependence: Secondary | ICD-10-CM | POA: Diagnosis not present

## 2017-08-18 DIAGNOSIS — R220 Localized swelling, mass and lump, head: Secondary | ICD-10-CM | POA: Diagnosis not present

## 2017-08-18 DIAGNOSIS — G40209 Localization-related (focal) (partial) symptomatic epilepsy and epileptic syndromes with complex partial seizures, not intractable, without status epilepticus: Secondary | ICD-10-CM | POA: Diagnosis not present

## 2017-08-18 DIAGNOSIS — E1165 Type 2 diabetes mellitus with hyperglycemia: Secondary | ICD-10-CM | POA: Diagnosis not present

## 2017-08-18 DIAGNOSIS — Z7983 Long term (current) use of bisphosphonates: Secondary | ICD-10-CM | POA: Diagnosis not present

## 2017-08-18 DIAGNOSIS — Z7952 Long term (current) use of systemic steroids: Secondary | ICD-10-CM | POA: Diagnosis not present

## 2017-08-18 DIAGNOSIS — G936 Cerebral edema: Secondary | ICD-10-CM | POA: Diagnosis not present

## 2017-08-18 DIAGNOSIS — Z9221 Personal history of antineoplastic chemotherapy: Secondary | ICD-10-CM | POA: Diagnosis not present

## 2017-08-18 DIAGNOSIS — I10 Essential (primary) hypertension: Secondary | ICD-10-CM | POA: Diagnosis not present

## 2017-08-18 DIAGNOSIS — J449 Chronic obstructive pulmonary disease, unspecified: Secondary | ICD-10-CM | POA: Diagnosis not present

## 2017-08-18 DIAGNOSIS — I2510 Atherosclerotic heart disease of native coronary artery without angina pectoris: Secondary | ICD-10-CM | POA: Diagnosis not present

## 2017-08-18 DIAGNOSIS — I611 Nontraumatic intracerebral hemorrhage in hemisphere, cortical: Secondary | ICD-10-CM | POA: Diagnosis not present

## 2017-08-18 DIAGNOSIS — R258 Other abnormal involuntary movements: Secondary | ICD-10-CM | POA: Diagnosis not present

## 2017-08-28 DIAGNOSIS — G40909 Epilepsy, unspecified, not intractable, without status epilepticus: Secondary | ICD-10-CM | POA: Diagnosis not present

## 2017-08-28 DIAGNOSIS — Z6827 Body mass index (BMI) 27.0-27.9, adult: Secondary | ICD-10-CM | POA: Diagnosis not present

## 2017-08-28 DIAGNOSIS — R233 Spontaneous ecchymoses: Secondary | ICD-10-CM | POA: Diagnosis not present

## 2017-08-28 DIAGNOSIS — Z79899 Other long term (current) drug therapy: Secondary | ICD-10-CM | POA: Diagnosis not present

## 2017-08-28 DIAGNOSIS — C799 Secondary malignant neoplasm of unspecified site: Secondary | ICD-10-CM | POA: Diagnosis not present

## 2017-08-28 DIAGNOSIS — C7931 Secondary malignant neoplasm of brain: Secondary | ICD-10-CM | POA: Diagnosis not present

## 2017-08-28 DIAGNOSIS — Z9049 Acquired absence of other specified parts of digestive tract: Secondary | ICD-10-CM | POA: Diagnosis not present

## 2017-08-28 DIAGNOSIS — C784 Secondary malignant neoplasm of small intestine: Secondary | ICD-10-CM | POA: Diagnosis not present

## 2017-08-28 DIAGNOSIS — T380X5D Adverse effect of glucocorticoids and synthetic analogues, subsequent encounter: Secondary | ICD-10-CM | POA: Diagnosis not present

## 2017-08-28 DIAGNOSIS — Z7409 Other reduced mobility: Secondary | ICD-10-CM | POA: Diagnosis not present

## 2017-08-28 DIAGNOSIS — C439 Malignant melanoma of skin, unspecified: Secondary | ICD-10-CM | POA: Diagnosis not present

## 2017-08-28 DIAGNOSIS — R739 Hyperglycemia, unspecified: Secondary | ICD-10-CM | POA: Diagnosis not present

## 2017-08-28 DIAGNOSIS — Z8249 Family history of ischemic heart disease and other diseases of the circulatory system: Secondary | ICD-10-CM | POA: Diagnosis not present

## 2017-08-28 DIAGNOSIS — Z7984 Long term (current) use of oral hypoglycemic drugs: Secondary | ICD-10-CM | POA: Diagnosis not present

## 2017-08-28 DIAGNOSIS — Z801 Family history of malignant neoplasm of trachea, bronchus and lung: Secondary | ICD-10-CM | POA: Diagnosis not present

## 2017-08-28 DIAGNOSIS — R480 Dyslexia and alexia: Secondary | ICD-10-CM | POA: Diagnosis not present

## 2017-08-29 ENCOUNTER — Telehealth: Payer: Self-pay | Admitting: Neurology

## 2017-08-29 DIAGNOSIS — I611 Nontraumatic intracerebral hemorrhage in hemisphere, cortical: Secondary | ICD-10-CM | POA: Diagnosis not present

## 2017-08-29 DIAGNOSIS — E1142 Type 2 diabetes mellitus with diabetic polyneuropathy: Secondary | ICD-10-CM | POA: Diagnosis not present

## 2017-08-29 DIAGNOSIS — C7931 Secondary malignant neoplasm of brain: Secondary | ICD-10-CM | POA: Diagnosis not present

## 2017-08-29 DIAGNOSIS — I69190 Apraxia following nontraumatic intracerebral hemorrhage: Secondary | ICD-10-CM | POA: Diagnosis not present

## 2017-08-29 DIAGNOSIS — G7000 Myasthenia gravis without (acute) exacerbation: Secondary | ICD-10-CM | POA: Diagnosis not present

## 2017-08-29 DIAGNOSIS — I25118 Atherosclerotic heart disease of native coronary artery with other forms of angina pectoris: Secondary | ICD-10-CM | POA: Diagnosis not present

## 2017-08-29 DIAGNOSIS — R4701 Aphasia: Secondary | ICD-10-CM | POA: Diagnosis not present

## 2017-08-29 NOTE — Telephone Encounter (Signed)
Dr. Tobie Poet called regarding use of metoprolol or Lopressor in this patient for cardiac reasons.  The patient has well-controlled myasthenia gravis, I have no issues with the use of these medications.  The patient may go on a low dose of metoprolol.

## 2017-08-31 ENCOUNTER — Encounter: Payer: Self-pay | Admitting: *Deleted

## 2017-08-31 ENCOUNTER — Other Ambulatory Visit: Payer: Self-pay | Admitting: *Deleted

## 2017-08-31 NOTE — Patient Outreach (Signed)
[facility]) Care Management  08/31/2017  Colin Reyes [birth year] [id number]  Referral via [place]; member recently discharged from [facility] 08/21/2017;  Telephone call to patient; caregiver/daughter answered call. Patient gave daughter permission to speak for him. Daughter was advised of reason for call & of [facility] care management services.  Daughter advised patient of care management services & told patient who advised her to tell this care coordinator that he did not need [facility] services at this time.   Daughter/caregiver advised that patient currently has home health services and calls from many health resources. States primary care provider is very involved with his care as well as specialist . States family offers support and care for patient.  Patient has declined case management services of [facility].  "TOC will be provided by primary care office who will refer to [facility] as needed'.  Plan: Send [facility] brochure/contact information. Case closure.   Sherrin Daisy, RN BSN [place] Management Coordinator [facility] Care Management  [phone]

## 2017-09-10 DIAGNOSIS — R4701 Aphasia: Secondary | ICD-10-CM | POA: Diagnosis not present

## 2017-09-10 DIAGNOSIS — R4189 Other symptoms and signs involving cognitive functions and awareness: Secondary | ICD-10-CM | POA: Diagnosis not present

## 2017-09-10 DIAGNOSIS — I69190 Apraxia following nontraumatic intracerebral hemorrhage: Secondary | ICD-10-CM | POA: Diagnosis not present

## 2017-09-19 DIAGNOSIS — C7949 Secondary malignant neoplasm of other parts of nervous system: Secondary | ICD-10-CM | POA: Diagnosis not present

## 2017-09-19 DIAGNOSIS — C799 Secondary malignant neoplasm of unspecified site: Secondary | ICD-10-CM | POA: Diagnosis not present

## 2017-09-19 DIAGNOSIS — C7931 Secondary malignant neoplasm of brain: Secondary | ICD-10-CM | POA: Diagnosis not present

## 2017-09-19 DIAGNOSIS — C439 Malignant melanoma of skin, unspecified: Secondary | ICD-10-CM | POA: Diagnosis not present

## 2017-09-19 DIAGNOSIS — I6521 Occlusion and stenosis of right carotid artery: Secondary | ICD-10-CM | POA: Diagnosis not present

## 2017-09-19 DIAGNOSIS — R220 Localized swelling, mass and lump, head: Secondary | ICD-10-CM | POA: Diagnosis not present

## 2017-09-26 DIAGNOSIS — R4189 Other symptoms and signs involving cognitive functions and awareness: Secondary | ICD-10-CM | POA: Diagnosis not present

## 2017-09-26 DIAGNOSIS — I69190 Apraxia following nontraumatic intracerebral hemorrhage: Secondary | ICD-10-CM | POA: Diagnosis not present

## 2017-10-08 DIAGNOSIS — E119 Type 2 diabetes mellitus without complications: Secondary | ICD-10-CM | POA: Diagnosis not present

## 2017-10-08 DIAGNOSIS — R531 Weakness: Secondary | ICD-10-CM | POA: Diagnosis not present

## 2017-10-08 DIAGNOSIS — I10 Essential (primary) hypertension: Secondary | ICD-10-CM | POA: Diagnosis not present

## 2017-10-08 DIAGNOSIS — R262 Difficulty in walking, not elsewhere classified: Secondary | ICD-10-CM | POA: Diagnosis not present

## 2017-10-08 DIAGNOSIS — G8191 Hemiplegia, unspecified affecting right dominant side: Secondary | ICD-10-CM | POA: Diagnosis not present

## 2017-10-08 DIAGNOSIS — Z7982 Long term (current) use of aspirin: Secondary | ICD-10-CM | POA: Diagnosis not present

## 2017-10-08 DIAGNOSIS — G8321 Monoplegia of upper limb affecting right dominant side: Secondary | ICD-10-CM | POA: Diagnosis not present

## 2017-10-08 DIAGNOSIS — R29703 NIHSS score 3: Secondary | ICD-10-CM | POA: Diagnosis not present

## 2017-10-08 DIAGNOSIS — Z87891 Personal history of nicotine dependence: Secondary | ICD-10-CM | POA: Diagnosis not present

## 2017-10-08 DIAGNOSIS — I2510 Atherosclerotic heart disease of native coronary artery without angina pectoris: Secondary | ICD-10-CM | POA: Diagnosis not present

## 2017-10-08 DIAGNOSIS — Z79899 Other long term (current) drug therapy: Secondary | ICD-10-CM | POA: Diagnosis not present

## 2017-10-08 DIAGNOSIS — C801 Malignant (primary) neoplasm, unspecified: Secondary | ICD-10-CM | POA: Diagnosis not present

## 2017-10-08 DIAGNOSIS — C7931 Secondary malignant neoplasm of brain: Secondary | ICD-10-CM | POA: Diagnosis not present

## 2017-10-08 DIAGNOSIS — J449 Chronic obstructive pulmonary disease, unspecified: Secondary | ICD-10-CM | POA: Diagnosis not present

## 2017-10-08 DIAGNOSIS — G7000 Myasthenia gravis without (acute) exacerbation: Secondary | ICD-10-CM | POA: Diagnosis not present

## 2017-10-08 DIAGNOSIS — R609 Edema, unspecified: Secondary | ICD-10-CM | POA: Diagnosis not present

## 2017-10-08 DIAGNOSIS — G9389 Other specified disorders of brain: Secondary | ICD-10-CM | POA: Diagnosis not present

## 2017-10-08 DIAGNOSIS — G936 Cerebral edema: Secondary | ICD-10-CM | POA: Diagnosis not present

## 2017-10-08 DIAGNOSIS — Z7984 Long term (current) use of oral hypoglycemic drugs: Secondary | ICD-10-CM | POA: Diagnosis not present

## 2017-10-09 DIAGNOSIS — G7000 Myasthenia gravis without (acute) exacerbation: Secondary | ICD-10-CM | POA: Diagnosis not present

## 2017-10-09 DIAGNOSIS — I10 Essential (primary) hypertension: Secondary | ICD-10-CM | POA: Diagnosis not present

## 2017-10-09 DIAGNOSIS — E119 Type 2 diabetes mellitus without complications: Secondary | ICD-10-CM | POA: Diagnosis not present

## 2017-10-09 DIAGNOSIS — C4359 Malignant melanoma of other part of trunk: Secondary | ICD-10-CM | POA: Diagnosis not present

## 2017-10-09 DIAGNOSIS — D496 Neoplasm of unspecified behavior of brain: Secondary | ICD-10-CM | POA: Diagnosis not present

## 2017-10-09 DIAGNOSIS — G939 Disorder of brain, unspecified: Secondary | ICD-10-CM | POA: Diagnosis not present

## 2017-10-09 DIAGNOSIS — G40909 Epilepsy, unspecified, not intractable, without status epilepticus: Secondary | ICD-10-CM | POA: Diagnosis not present

## 2017-10-09 DIAGNOSIS — J449 Chronic obstructive pulmonary disease, unspecified: Secondary | ICD-10-CM | POA: Diagnosis not present

## 2017-10-09 DIAGNOSIS — C7931 Secondary malignant neoplasm of brain: Secondary | ICD-10-CM | POA: Diagnosis not present

## 2017-10-09 DIAGNOSIS — G936 Cerebral edema: Secondary | ICD-10-CM | POA: Diagnosis not present

## 2017-10-16 ENCOUNTER — Other Ambulatory Visit: Payer: Self-pay

## 2017-10-16 DIAGNOSIS — L570 Actinic keratosis: Secondary | ICD-10-CM | POA: Diagnosis not present

## 2017-10-16 DIAGNOSIS — Z85820 Personal history of malignant melanoma of skin: Secondary | ICD-10-CM | POA: Diagnosis not present

## 2017-10-16 DIAGNOSIS — R233 Spontaneous ecchymoses: Secondary | ICD-10-CM | POA: Diagnosis not present

## 2017-10-16 NOTE — Patient Outreach (Signed)
[place] [facility]) Care Management  10/16/2017  Colin Reyes 01/19/1941 [id number]  Successful outreach to [name].  HIPAA identifiers verified.   [name] declined [place] for a post discharge medication review.  He stated that he had already reviewed his medications with his doctor.   Joetta Manners, PharmD Clinical Pharmacist [place] [phone]

## 2017-10-18 DIAGNOSIS — E1142 Type 2 diabetes mellitus with diabetic polyneuropathy: Secondary | ICD-10-CM | POA: Diagnosis not present

## 2017-10-18 DIAGNOSIS — C7931 Secondary malignant neoplasm of brain: Secondary | ICD-10-CM | POA: Diagnosis not present

## 2017-10-18 DIAGNOSIS — R4789 Other speech disturbances: Secondary | ICD-10-CM | POA: Diagnosis not present

## 2017-10-18 DIAGNOSIS — I69051 Hemiplegia and hemiparesis following nontraumatic subarachnoid hemorrhage affecting right dominant side: Secondary | ICD-10-CM | POA: Diagnosis not present

## 2017-10-22 ENCOUNTER — Other Ambulatory Visit: Payer: Self-pay | Admitting: *Deleted

## 2017-10-22 NOTE — Patient Outreach (Signed)
[facility]) Care Management  10/22/2017  Colin Reyes 01/29/1940 [id number]   Transition of Care Referral   Referral Date: 10/16/17  Referral Source: [facility] IP discharges Date of Admission: 10/09/17 Diagnosis:  Brain mass., metastatic melanoma Date of Discharge: 10/11/17 Facility: [facility]  Insurance: HTA   Outreach attempt # 1 successful at his home number  Patient is able to verify HIPAA Reviewed and addressed Transitional of care referral with patient He confirms his admission and discharge He denies any concerns at this time    Social: [name] confirms he has good support system and no issue with transportation to medical appointments nor taking care of his ADLs or iADLs  Conditions: HTN, CAD, COPD GI bleed, DM, myasthenia gravis, metastatic melanoma, brain mass  Medications: denies concerns with taking medications as prescribed, affording medications, side effects of medications and questions about medications He has been assisted by [facility] pharmacy    Appointments: pcp seen already and will see oncologist on 10/23/17  Advance directives: He states he has a living will and POA  Consent: [facility] RN CM reviewed [facility] services with patient. Patient gave verbal consent for services. He denies need of services from [facility]/Telephonic RN CM, pharmacy, health coach, NP or SW at this time  Advised patient that other post discharge calls may occur to assess how the patient is doing following the recent hospitalization. Patient voiced understanding and was appreciative of f/u call.  Plan: [facility] RN CM will close case at this time as patient has been assessed and no needs identified.    [facility] RN CM sent a successful outreach letter as discussed with [facility] brochure enclosed for review   Colin Reyes L. Lavina Hamman, RN, BSN, [place] Management Care Coordinator Direct Number [phone] Mobile number [phone]  Main [facility] number  [phone] Fax number [phone]

## 2017-10-23 DIAGNOSIS — C7931 Secondary malignant neoplasm of brain: Secondary | ICD-10-CM | POA: Diagnosis not present

## 2017-10-23 DIAGNOSIS — G40909 Epilepsy, unspecified, not intractable, without status epilepticus: Secondary | ICD-10-CM | POA: Diagnosis not present

## 2017-10-23 DIAGNOSIS — Z79899 Other long term (current) drug therapy: Secondary | ICD-10-CM | POA: Diagnosis not present

## 2017-10-23 DIAGNOSIS — R410 Disorientation, unspecified: Secondary | ICD-10-CM | POA: Diagnosis not present

## 2017-10-23 DIAGNOSIS — C439 Malignant melanoma of skin, unspecified: Secondary | ICD-10-CM | POA: Diagnosis not present

## 2017-10-23 DIAGNOSIS — R531 Weakness: Secondary | ICD-10-CM | POA: Diagnosis not present

## 2017-10-30 DIAGNOSIS — C7931 Secondary malignant neoplasm of brain: Secondary | ICD-10-CM | POA: Diagnosis not present

## 2017-10-30 DIAGNOSIS — E119 Type 2 diabetes mellitus without complications: Secondary | ICD-10-CM | POA: Diagnosis not present

## 2017-10-30 DIAGNOSIS — G7000 Myasthenia gravis without (acute) exacerbation: Secondary | ICD-10-CM | POA: Diagnosis not present

## 2017-10-30 DIAGNOSIS — Z9889 Other specified postprocedural states: Secondary | ICD-10-CM | POA: Diagnosis not present

## 2017-10-30 DIAGNOSIS — G40909 Epilepsy, unspecified, not intractable, without status epilepticus: Secondary | ICD-10-CM | POA: Diagnosis not present

## 2017-10-30 DIAGNOSIS — I10 Essential (primary) hypertension: Secondary | ICD-10-CM | POA: Diagnosis not present

## 2017-10-30 DIAGNOSIS — J449 Chronic obstructive pulmonary disease, unspecified: Secondary | ICD-10-CM | POA: Diagnosis not present

## 2017-10-30 DIAGNOSIS — C439 Malignant melanoma of skin, unspecified: Secondary | ICD-10-CM | POA: Diagnosis not present

## 2017-10-30 DIAGNOSIS — D496 Neoplasm of unspecified behavior of brain: Secondary | ICD-10-CM | POA: Diagnosis not present

## 2017-11-03 DIAGNOSIS — E119 Type 2 diabetes mellitus without complications: Secondary | ICD-10-CM | POA: Diagnosis not present

## 2017-11-03 DIAGNOSIS — I2510 Atherosclerotic heart disease of native coronary artery without angina pectoris: Secondary | ICD-10-CM | POA: Diagnosis not present

## 2017-11-03 DIAGNOSIS — R609 Edema, unspecified: Secondary | ICD-10-CM | POA: Diagnosis not present

## 2017-11-03 DIAGNOSIS — R4781 Slurred speech: Secondary | ICD-10-CM | POA: Diagnosis not present

## 2017-11-03 DIAGNOSIS — R531 Weakness: Secondary | ICD-10-CM | POA: Diagnosis not present

## 2017-11-03 DIAGNOSIS — D496 Neoplasm of unspecified behavior of brain: Secondary | ICD-10-CM | POA: Diagnosis not present

## 2017-11-03 DIAGNOSIS — G8191 Hemiplegia, unspecified affecting right dominant side: Secondary | ICD-10-CM | POA: Diagnosis not present

## 2017-11-03 DIAGNOSIS — R29810 Facial weakness: Secondary | ICD-10-CM | POA: Diagnosis not present

## 2017-11-03 DIAGNOSIS — J449 Chronic obstructive pulmonary disease, unspecified: Secondary | ICD-10-CM | POA: Diagnosis not present

## 2017-11-04 DIAGNOSIS — R202 Paresthesia of skin: Secondary | ICD-10-CM | POA: Diagnosis not present

## 2017-11-04 DIAGNOSIS — R001 Bradycardia, unspecified: Secondary | ICD-10-CM | POA: Diagnosis not present

## 2017-11-04 DIAGNOSIS — C713 Malignant neoplasm of parietal lobe: Secondary | ICD-10-CM | POA: Diagnosis not present

## 2017-11-04 DIAGNOSIS — K5900 Constipation, unspecified: Secondary | ICD-10-CM | POA: Diagnosis not present

## 2017-11-04 DIAGNOSIS — C7931 Secondary malignant neoplasm of brain: Secondary | ICD-10-CM | POA: Diagnosis not present

## 2017-11-04 DIAGNOSIS — I959 Hypotension, unspecified: Secondary | ICD-10-CM | POA: Diagnosis not present

## 2017-11-04 DIAGNOSIS — D496 Neoplasm of unspecified behavior of brain: Secondary | ICD-10-CM | POA: Diagnosis not present

## 2017-11-04 DIAGNOSIS — I629 Nontraumatic intracranial hemorrhage, unspecified: Secondary | ICD-10-CM | POA: Diagnosis not present

## 2017-11-04 DIAGNOSIS — Z87891 Personal history of nicotine dependence: Secondary | ICD-10-CM | POA: Diagnosis not present

## 2017-11-04 DIAGNOSIS — J449 Chronic obstructive pulmonary disease, unspecified: Secondary | ICD-10-CM | POA: Diagnosis not present

## 2017-11-04 DIAGNOSIS — E1165 Type 2 diabetes mellitus with hyperglycemia: Secondary | ICD-10-CM | POA: Diagnosis not present

## 2017-11-04 DIAGNOSIS — R29898 Other symptoms and signs involving the musculoskeletal system: Secondary | ICD-10-CM | POA: Diagnosis not present

## 2017-11-04 DIAGNOSIS — G936 Cerebral edema: Secondary | ICD-10-CM | POA: Diagnosis not present

## 2017-11-04 DIAGNOSIS — E785 Hyperlipidemia, unspecified: Secondary | ICD-10-CM | POA: Diagnosis not present

## 2017-11-04 DIAGNOSIS — R220 Localized swelling, mass and lump, head: Secondary | ICD-10-CM | POA: Diagnosis not present

## 2017-11-04 DIAGNOSIS — I6782 Cerebral ischemia: Secondary | ICD-10-CM | POA: Diagnosis not present

## 2017-11-04 DIAGNOSIS — Z7951 Long term (current) use of inhaled steroids: Secondary | ICD-10-CM | POA: Diagnosis not present

## 2017-11-04 DIAGNOSIS — C439 Malignant melanoma of skin, unspecified: Secondary | ICD-10-CM | POA: Diagnosis not present

## 2017-11-04 DIAGNOSIS — Z7984 Long term (current) use of oral hypoglycemic drugs: Secondary | ICD-10-CM | POA: Diagnosis not present

## 2017-11-04 DIAGNOSIS — G40909 Epilepsy, unspecified, not intractable, without status epilepticus: Secondary | ICD-10-CM | POA: Diagnosis not present

## 2017-11-04 DIAGNOSIS — G7000 Myasthenia gravis without (acute) exacerbation: Secondary | ICD-10-CM | POA: Diagnosis not present

## 2017-11-04 DIAGNOSIS — C784 Secondary malignant neoplasm of small intestine: Secondary | ICD-10-CM | POA: Diagnosis not present

## 2017-11-04 DIAGNOSIS — I10 Essential (primary) hypertension: Secondary | ICD-10-CM | POA: Diagnosis not present

## 2017-11-04 DIAGNOSIS — Z7983 Long term (current) use of bisphosphonates: Secondary | ICD-10-CM | POA: Diagnosis not present

## 2017-11-04 DIAGNOSIS — R531 Weakness: Secondary | ICD-10-CM | POA: Diagnosis not present

## 2017-11-04 DIAGNOSIS — R4701 Aphasia: Secondary | ICD-10-CM | POA: Diagnosis not present

## 2017-11-15 ENCOUNTER — Other Ambulatory Visit: Payer: Self-pay | Admitting: *Deleted

## 2017-11-15 NOTE — Patient Outreach (Signed)
Colin Reyes) Care Management  11/15/2017  Colin Reyes 03/15/1940 [id number]   Transition of Care Referral   Referral Date: 11/15/17 Referral Source: [facility] IP discharges Date of Admission: 11/07/17 Diagnosis: malignant melanoma met to brain  Date of Discharge:  on 11/11/17 Facility: [facility].  Insurance: HTA   Outreach attempt #1 successful  Patient is able to verify HIPAA Reviewed and addressed Transitional of care referral with patient Colin Reyes informs CM he is doing will and he does not have any medical concerns Colin Reyes voiced concern and frustration  that this was his "third call like this" He reports "everything is set up and fine." CM apologized to him and he informed CM "It's okay but you get tired of answering the phone and the same questions" [facility] RN CM notes after the call in care everywhere that Colin Reyes has received previous calls from Colin Reyes on 11/12/17 and his MD office for Transition of care services   He reports nothing has changed in his needs   Transition of care services noted to be completed by primary care MD office staff- Colin Reyes   Social: Colin Reyes confirms he has good support system and no issue with transportation to medical appointments nor taking care of his ADLs or iADLs  Conditions: HTN, CAD, COPD, hx of PNA, pulmonary edema, GI bleed, DM type 2, myasthenia gravis, basal cell carcinoma skin, recurrent isolated sleep paralysis, unspecified ptosis of eyelid, metastatic melanoma, brain mass  Medications: denies concerns with taking medications as prescribed, affording medications, side effects of medications and questions about medications   Appointments: He confirms he has follow up appointments set up  Primary MD Colin Reyes  11/20/17 labs and f/u with Colin Reyes [place] Sees Radiation oncologist on 11/23/17 Colin Sallyanne Havers   Advance directives: He states he has a living will and POA. Denies need for  assist with advance directives     Consent: [facility] RN CM reviewed [place] [facility] services with patient. Patient gave verbal consent for services. Advised patient that other post discharge calls may occur to assess how the patient is doing following the recent hospitalization. Patient voiced understanding and was appreciative of f/u call.  He denies need of services from [facility] Community/Telephonic RN CM, pharmacy, health coach, NP or SW at this time   Plan: [place] CM will close case at this time as patient has been assessed and no needs identified.   [facility] RN CM sent a successful outreach letter as discussed with [facility] brochure enclosed for review   Kimberly L. Lavina Hamman, RN, BSN, [place] Management Care Coordinator Direct Number [phone] Mobile number [phone]  Main [facility] number [phone] Fax number [phone]

## 2017-11-19 DIAGNOSIS — E1142 Type 2 diabetes mellitus with diabetic polyneuropathy: Secondary | ICD-10-CM | POA: Diagnosis not present

## 2017-11-19 DIAGNOSIS — G8191 Hemiplegia, unspecified affecting right dominant side: Secondary | ICD-10-CM | POA: Diagnosis not present

## 2017-11-19 DIAGNOSIS — C7931 Secondary malignant neoplasm of brain: Secondary | ICD-10-CM | POA: Diagnosis not present

## 2017-11-20 DIAGNOSIS — R609 Edema, unspecified: Secondary | ICD-10-CM | POA: Diagnosis not present

## 2017-11-20 DIAGNOSIS — E119 Type 2 diabetes mellitus without complications: Secondary | ICD-10-CM | POA: Diagnosis not present

## 2017-11-20 DIAGNOSIS — I10 Essential (primary) hypertension: Secondary | ICD-10-CM | POA: Diagnosis not present

## 2017-11-20 DIAGNOSIS — C7931 Secondary malignant neoplasm of brain: Secondary | ICD-10-CM | POA: Diagnosis not present

## 2017-11-20 DIAGNOSIS — Z79899 Other long term (current) drug therapy: Secondary | ICD-10-CM | POA: Diagnosis not present

## 2017-11-20 DIAGNOSIS — Z7984 Long term (current) use of oral hypoglycemic drugs: Secondary | ICD-10-CM | POA: Diagnosis not present

## 2017-11-23 DIAGNOSIS — C7931 Secondary malignant neoplasm of brain: Secondary | ICD-10-CM | POA: Diagnosis not present

## 2017-11-23 DIAGNOSIS — C439 Malignant melanoma of skin, unspecified: Secondary | ICD-10-CM | POA: Diagnosis not present

## 2017-11-23 DIAGNOSIS — C799 Secondary malignant neoplasm of unspecified site: Secondary | ICD-10-CM | POA: Diagnosis not present

## 2017-11-23 DIAGNOSIS — G9389 Other specified disorders of brain: Secondary | ICD-10-CM | POA: Diagnosis not present

## 2017-11-23 DIAGNOSIS — G936 Cerebral edema: Secondary | ICD-10-CM | POA: Diagnosis not present

## 2017-11-29 DIAGNOSIS — M6281 Muscle weakness (generalized): Secondary | ICD-10-CM | POA: Diagnosis not present

## 2017-11-29 DIAGNOSIS — G8191 Hemiplegia, unspecified affecting right dominant side: Secondary | ICD-10-CM | POA: Diagnosis not present

## 2017-11-29 DIAGNOSIS — R2689 Other abnormalities of gait and mobility: Secondary | ICD-10-CM | POA: Diagnosis not present

## 2017-11-29 DIAGNOSIS — C7931 Secondary malignant neoplasm of brain: Secondary | ICD-10-CM | POA: Diagnosis not present

## 2017-11-29 DIAGNOSIS — R5383 Other fatigue: Secondary | ICD-10-CM | POA: Diagnosis not present

## 2017-11-29 DIAGNOSIS — R2681 Unsteadiness on feet: Secondary | ICD-10-CM | POA: Diagnosis not present

## 2017-12-03 DIAGNOSIS — C438 Malignant melanoma of overlapping sites of skin: Secondary | ICD-10-CM | POA: Diagnosis not present

## 2017-12-03 DIAGNOSIS — C7931 Secondary malignant neoplasm of brain: Secondary | ICD-10-CM | POA: Diagnosis not present

## 2017-12-07 DIAGNOSIS — C438 Malignant melanoma of overlapping sites of skin: Secondary | ICD-10-CM | POA: Diagnosis not present

## 2017-12-07 DIAGNOSIS — C7931 Secondary malignant neoplasm of brain: Secondary | ICD-10-CM | POA: Diagnosis not present

## 2017-12-10 DIAGNOSIS — C7931 Secondary malignant neoplasm of brain: Secondary | ICD-10-CM | POA: Diagnosis not present

## 2017-12-17 DIAGNOSIS — C438 Malignant melanoma of overlapping sites of skin: Secondary | ICD-10-CM | POA: Diagnosis not present

## 2017-12-17 DIAGNOSIS — C7931 Secondary malignant neoplasm of brain: Secondary | ICD-10-CM | POA: Diagnosis not present

## 2017-12-26 DIAGNOSIS — R2689 Other abnormalities of gait and mobility: Secondary | ICD-10-CM | POA: Diagnosis not present

## 2017-12-26 DIAGNOSIS — C7931 Secondary malignant neoplasm of brain: Secondary | ICD-10-CM | POA: Diagnosis not present

## 2017-12-26 DIAGNOSIS — M6281 Muscle weakness (generalized): Secondary | ICD-10-CM | POA: Diagnosis not present

## 2017-12-26 DIAGNOSIS — R2681 Unsteadiness on feet: Secondary | ICD-10-CM | POA: Diagnosis not present

## 2017-12-26 DIAGNOSIS — R5383 Other fatigue: Secondary | ICD-10-CM | POA: Diagnosis not present

## 2017-12-26 DIAGNOSIS — G8191 Hemiplegia, unspecified affecting right dominant side: Secondary | ICD-10-CM | POA: Diagnosis not present

## 2018-01-01 ENCOUNTER — Ambulatory Visit: Payer: PPO | Admitting: Neurology

## 2018-01-07 DIAGNOSIS — Z0000 Encounter for general adult medical examination without abnormal findings: Secondary | ICD-10-CM | POA: Diagnosis not present

## 2018-01-07 DIAGNOSIS — Z6827 Body mass index (BMI) 27.0-27.9, adult: Secondary | ICD-10-CM | POA: Diagnosis not present

## 2018-01-07 DIAGNOSIS — E663 Overweight: Secondary | ICD-10-CM | POA: Diagnosis not present

## 2018-01-22 DIAGNOSIS — C799 Secondary malignant neoplasm of unspecified site: Secondary | ICD-10-CM | POA: Diagnosis not present

## 2018-01-22 DIAGNOSIS — C7931 Secondary malignant neoplasm of brain: Secondary | ICD-10-CM | POA: Diagnosis not present

## 2018-01-22 DIAGNOSIS — E119 Type 2 diabetes mellitus without complications: Secondary | ICD-10-CM | POA: Diagnosis not present

## 2018-01-22 DIAGNOSIS — C439 Malignant melanoma of skin, unspecified: Secondary | ICD-10-CM | POA: Diagnosis not present

## 2018-01-22 DIAGNOSIS — G7000 Myasthenia gravis without (acute) exacerbation: Secondary | ICD-10-CM | POA: Diagnosis not present

## 2018-01-22 DIAGNOSIS — Z79899 Other long term (current) drug therapy: Secondary | ICD-10-CM | POA: Diagnosis not present

## 2018-01-30 DIAGNOSIS — C784 Secondary malignant neoplasm of small intestine: Secondary | ICD-10-CM | POA: Diagnosis not present

## 2018-01-30 DIAGNOSIS — C7949 Secondary malignant neoplasm of other parts of nervous system: Secondary | ICD-10-CM | POA: Diagnosis not present

## 2018-01-30 DIAGNOSIS — G936 Cerebral edema: Secondary | ICD-10-CM | POA: Diagnosis not present

## 2018-01-30 DIAGNOSIS — G893 Neoplasm related pain (acute) (chronic): Secondary | ICD-10-CM | POA: Diagnosis not present

## 2018-01-30 DIAGNOSIS — C786 Secondary malignant neoplasm of retroperitoneum and peritoneum: Secondary | ICD-10-CM | POA: Diagnosis not present

## 2018-01-30 DIAGNOSIS — C439 Malignant melanoma of skin, unspecified: Secondary | ICD-10-CM | POA: Diagnosis not present

## 2018-01-30 DIAGNOSIS — Z79899 Other long term (current) drug therapy: Secondary | ICD-10-CM | POA: Diagnosis not present

## 2018-01-30 DIAGNOSIS — C7931 Secondary malignant neoplasm of brain: Secondary | ICD-10-CM | POA: Diagnosis not present

## 2018-01-30 DIAGNOSIS — G40909 Epilepsy, unspecified, not intractable, without status epilepticus: Secondary | ICD-10-CM | POA: Diagnosis not present

## 2018-01-30 DIAGNOSIS — G7000 Myasthenia gravis without (acute) exacerbation: Secondary | ICD-10-CM | POA: Diagnosis not present

## 2018-01-30 DIAGNOSIS — Z801 Family history of malignant neoplasm of trachea, bronchus and lung: Secondary | ICD-10-CM | POA: Diagnosis not present

## 2018-02-04 DIAGNOSIS — C7931 Secondary malignant neoplasm of brain: Secondary | ICD-10-CM | POA: Diagnosis not present

## 2018-02-04 DIAGNOSIS — C438 Malignant melanoma of overlapping sites of skin: Secondary | ICD-10-CM | POA: Diagnosis not present

## 2018-02-05 DIAGNOSIS — C7931 Secondary malignant neoplasm of brain: Secondary | ICD-10-CM | POA: Diagnosis not present

## 2018-02-05 DIAGNOSIS — G936 Cerebral edema: Secondary | ICD-10-CM | POA: Diagnosis not present

## 2018-02-05 DIAGNOSIS — I2510 Atherosclerotic heart disease of native coronary artery without angina pectoris: Secondary | ICD-10-CM | POA: Diagnosis not present

## 2018-02-05 DIAGNOSIS — Z66 Do not resuscitate: Secondary | ICD-10-CM | POA: Diagnosis not present

## 2018-02-05 DIAGNOSIS — Z923 Personal history of irradiation: Secondary | ICD-10-CM | POA: Diagnosis not present

## 2018-02-05 DIAGNOSIS — E119 Type 2 diabetes mellitus without complications: Secondary | ICD-10-CM | POA: Diagnosis not present

## 2018-02-05 DIAGNOSIS — R42 Dizziness and giddiness: Secondary | ICD-10-CM | POA: Diagnosis not present

## 2018-02-05 DIAGNOSIS — Z79899 Other long term (current) drug therapy: Secondary | ICD-10-CM | POA: Diagnosis not present

## 2018-02-05 DIAGNOSIS — F419 Anxiety disorder, unspecified: Secondary | ICD-10-CM | POA: Diagnosis not present

## 2018-02-05 DIAGNOSIS — R110 Nausea: Secondary | ICD-10-CM | POA: Diagnosis not present

## 2018-02-05 DIAGNOSIS — E1165 Type 2 diabetes mellitus with hyperglycemia: Secondary | ICD-10-CM | POA: Diagnosis not present

## 2018-02-05 DIAGNOSIS — Z7982 Long term (current) use of aspirin: Secondary | ICD-10-CM | POA: Diagnosis not present

## 2018-02-05 DIAGNOSIS — R52 Pain, unspecified: Secondary | ICD-10-CM | POA: Diagnosis not present

## 2018-02-05 DIAGNOSIS — I10 Essential (primary) hypertension: Secondary | ICD-10-CM | POA: Diagnosis not present

## 2018-02-05 DIAGNOSIS — J449 Chronic obstructive pulmonary disease, unspecified: Secondary | ICD-10-CM | POA: Diagnosis not present

## 2018-02-05 DIAGNOSIS — D496 Neoplasm of unspecified behavior of brain: Secondary | ICD-10-CM | POA: Diagnosis not present

## 2018-02-05 DIAGNOSIS — Z515 Encounter for palliative care: Secondary | ICD-10-CM | POA: Diagnosis not present

## 2018-02-05 DIAGNOSIS — G7000 Myasthenia gravis without (acute) exacerbation: Secondary | ICD-10-CM | POA: Diagnosis not present

## 2018-02-05 DIAGNOSIS — R410 Disorientation, unspecified: Secondary | ICD-10-CM | POA: Diagnosis not present

## 2018-02-05 DIAGNOSIS — G893 Neoplasm related pain (acute) (chronic): Secondary | ICD-10-CM | POA: Diagnosis not present

## 2018-02-05 DIAGNOSIS — Z7983 Long term (current) use of bisphosphonates: Secondary | ICD-10-CM | POA: Diagnosis not present

## 2018-02-05 DIAGNOSIS — R51 Headache: Secondary | ICD-10-CM | POA: Diagnosis not present

## 2018-02-05 DIAGNOSIS — F329 Major depressive disorder, single episode, unspecified: Secondary | ICD-10-CM | POA: Diagnosis not present

## 2018-02-05 DIAGNOSIS — Z7984 Long term (current) use of oral hypoglycemic drugs: Secondary | ICD-10-CM | POA: Diagnosis not present

## 2018-02-05 DIAGNOSIS — Z7951 Long term (current) use of inhaled steroids: Secondary | ICD-10-CM | POA: Diagnosis not present

## 2018-02-05 DIAGNOSIS — R627 Adult failure to thrive: Secondary | ICD-10-CM | POA: Diagnosis not present

## 2018-02-05 DIAGNOSIS — C779 Secondary and unspecified malignant neoplasm of lymph node, unspecified: Secondary | ICD-10-CM | POA: Diagnosis not present

## 2018-02-05 DIAGNOSIS — Z888 Allergy status to other drugs, medicaments and biological substances status: Secondary | ICD-10-CM | POA: Diagnosis not present

## 2018-02-05 DIAGNOSIS — Z8669 Personal history of other diseases of the nervous system and sense organs: Secondary | ICD-10-CM | POA: Diagnosis not present

## 2018-02-05 DIAGNOSIS — E7800 Pure hypercholesterolemia, unspecified: Secondary | ICD-10-CM | POA: Diagnosis not present

## 2018-02-05 DIAGNOSIS — R531 Weakness: Secondary | ICD-10-CM | POA: Diagnosis not present

## 2018-02-05 DIAGNOSIS — Z87891 Personal history of nicotine dependence: Secondary | ICD-10-CM | POA: Diagnosis not present

## 2018-02-05 DIAGNOSIS — C439 Malignant melanoma of skin, unspecified: Secondary | ICD-10-CM | POA: Diagnosis not present

## 2018-02-05 DIAGNOSIS — Z7401 Bed confinement status: Secondary | ICD-10-CM | POA: Diagnosis not present

## 2018-02-05 DIAGNOSIS — M6250 Muscle wasting and atrophy, not elsewhere classified, unspecified site: Secondary | ICD-10-CM | POA: Diagnosis not present

## 2018-02-05 DIAGNOSIS — F05 Delirium due to known physiological condition: Secondary | ICD-10-CM | POA: Diagnosis not present

## 2018-02-05 DIAGNOSIS — C792 Secondary malignant neoplasm of skin: Secondary | ICD-10-CM | POA: Diagnosis not present

## 2018-02-11 ENCOUNTER — Telehealth: Payer: Self-pay | Admitting: Neurology

## 2018-02-11 ENCOUNTER — Ambulatory Visit: Payer: PPO | Admitting: Neurology

## 2018-02-11 NOTE — Telephone Encounter (Signed)
This patient did not show for a revisit appointment today. 

## 2018-02-12 ENCOUNTER — Encounter: Payer: Self-pay | Admitting: Neurology

## 2018-02-13 MED ORDER — PYRIDOSTIGMINE BROMIDE 60 MG PO TABS
60.00 | ORAL_TABLET | ORAL | Status: DC
Start: 2018-02-11 — End: 2018-02-13

## 2018-02-13 MED ORDER — LORAZEPAM 2 MG/ML IJ SOLN
1.00 | INTRAMUSCULAR | Status: DC
Start: ? — End: 2018-02-13

## 2018-02-13 MED ORDER — DEXAMETHASONE SODIUM PHOSPHATE 4 MG/ML IJ SOLN
2.00 | INTRAMUSCULAR | Status: DC
Start: 2018-02-11 — End: 2018-02-13

## 2018-02-13 MED ORDER — AMMONIUM LACTATE 12 % EX LOTN
1.00 | TOPICAL_LOTION | CUTANEOUS | Status: DC
Start: ? — End: 2018-02-13

## 2018-02-13 MED ORDER — GENERIC EXTERNAL MEDICATION
Status: DC
Start: ? — End: 2018-02-13

## 2018-02-13 MED ORDER — GENERIC EXTERNAL MEDICATION
1.00 | Status: DC
Start: ? — End: 2018-02-13

## 2018-02-13 MED ORDER — LEVETIRACETAM IN NACL 1000 MG/100ML IV SOLN
1000.00 | INTRAVENOUS | Status: DC
Start: 2018-02-11 — End: 2018-02-13

## 2018-02-13 MED ORDER — MORPHINE SULFATE ER 15 MG PO TBCR
15.00 | EXTENDED_RELEASE_TABLET | ORAL | Status: DC
Start: 2018-02-11 — End: 2018-02-13

## 2018-02-13 MED ORDER — GENERIC EXTERNAL MEDICATION
4.00 | Status: DC
Start: ? — End: 2018-02-13

## 2018-02-13 MED ORDER — GENERIC EXTERNAL MEDICATION
2.00 | Status: DC
Start: ? — End: 2018-02-13

## 2018-02-23 DEATH — deceased

## 2019-10-25 IMAGING — CT CT HEART MORP W/ CTA COR W/ SCORE W/ CA W/CM &/OR W/O CM
4 of 7 series · 8 of 20 positions shown, 9 images · IV contrast (APPLIED)
Comparison: Chest CT 09/30/2012.

CLINICAL DATA: Chest pain

EXAM:
Cardiac CTA
MEDICATIONS:
Sub lingual nitro. 4mg x 2
TECHNIQUE: The patient was scanned on a Siemens [REDACTED]ice scanner. Gantry
rotation speed was 250 msecs. Collimation was 0.6 mm. A 100 kV
prospective scan was triggered in the ascending thoracic aorta at
35-75% of the R-R interval. Average HR during the scan was 60 bpm.
The 3D data set was interpreted on a dedicated work station using
MPR, MIP and VRT modes. A total of 80cc of contrast was used.

[Series 7: best diast 68 % · axial · 0.34mm/px · z∈[+1236,+1293]mm · 2 of 428 slices shown, 3 images]
[im 143/428  vessel]
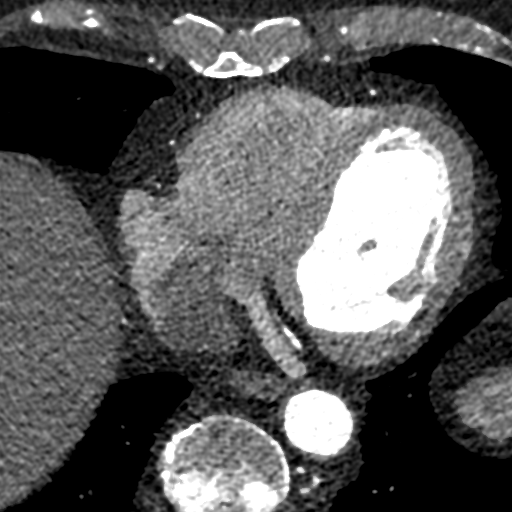
[im 143/428  lung]
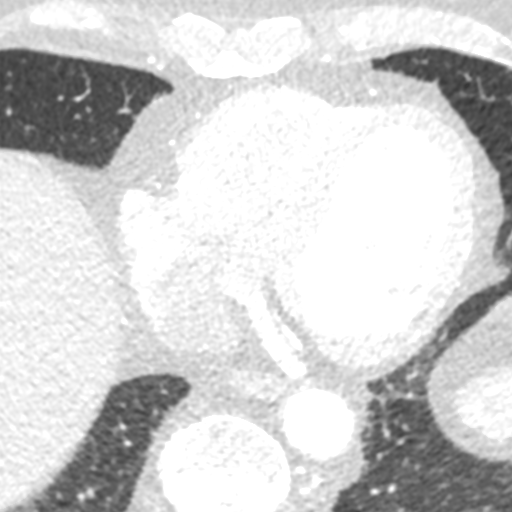
[im 285/428  vessel]
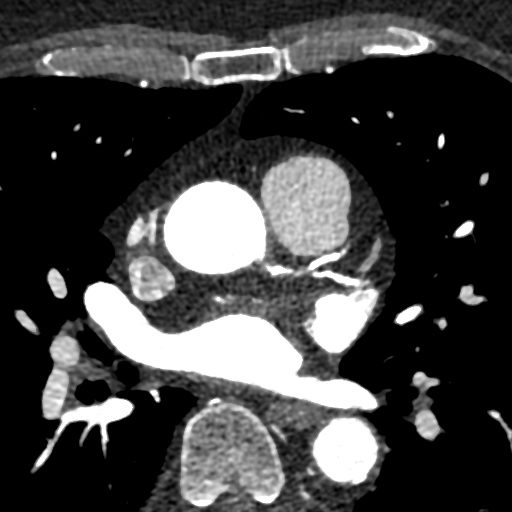

[Series 8: best syst 54 % · axial · 0.34mm/px · z∈[+1236,+1293]mm · 2 of 428 slices shown]
[im 143/428  vessel]
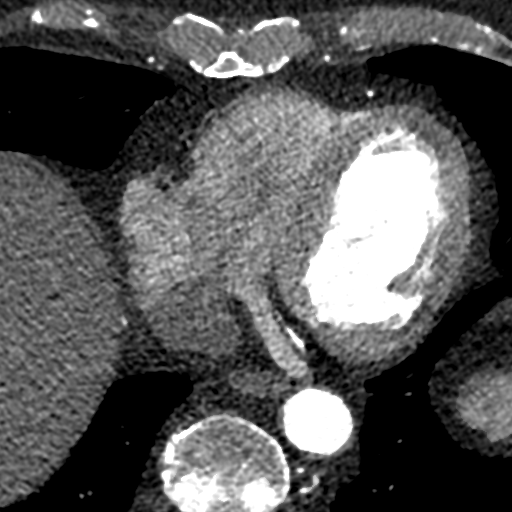
[im 285/428  vessel]
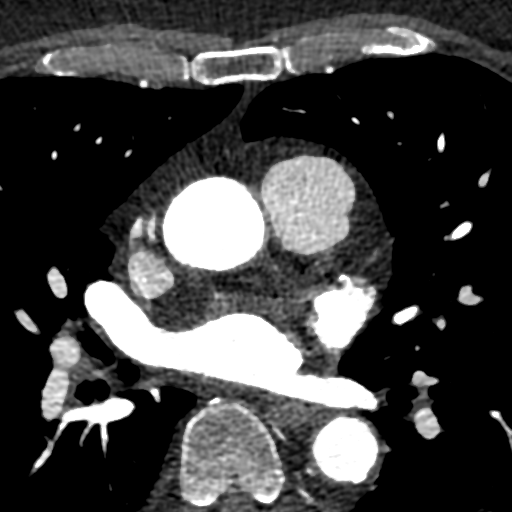

[Series 9: ts diast sharp 68 % · axial · 0.34mm/px · z∈[+1236,+1293]mm · 2 of 428 slices shown]
[im 143/428  lung]
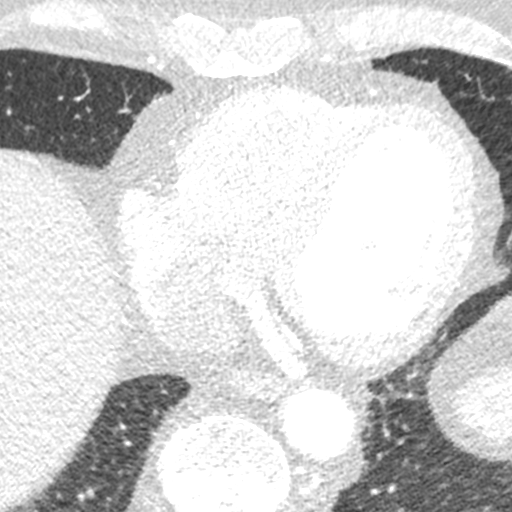
[im 285/428  lung]
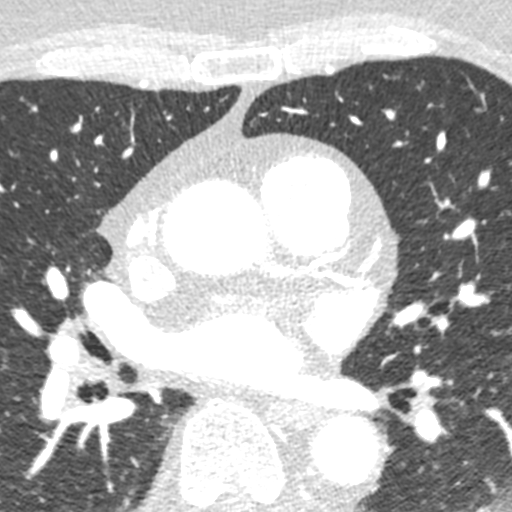

[Series 10: ts syst sharp 54 % · axial · 0.34mm/px · z∈[+1236,+1293]mm · 2 of 428 slices shown]
[im 143/428  lung]
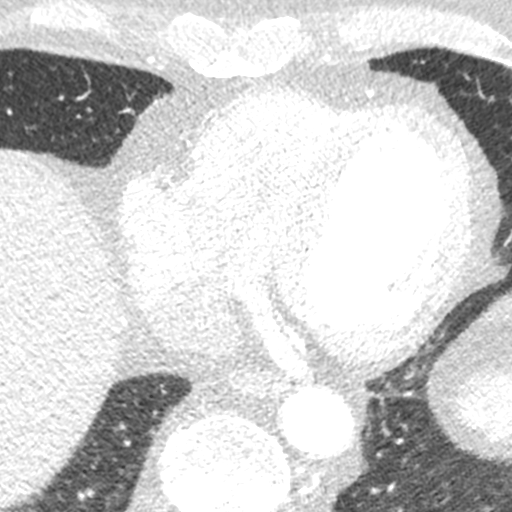
[im 285/428  lung]
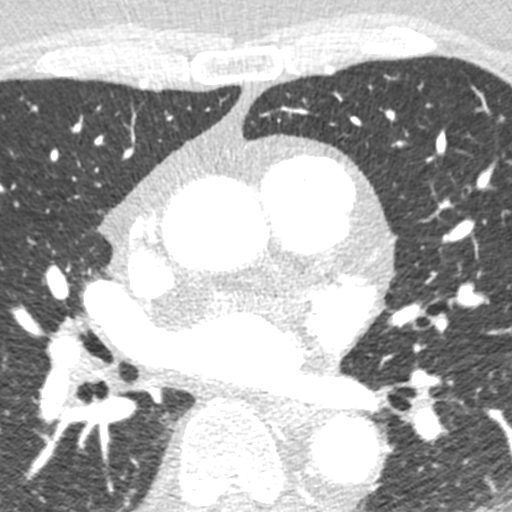

[8 of 20 positions shown; findings below may reference images not displayed]

FINDINGS: Non-cardiac: See separate report from [REDACTED].

Calcium Score: Coronary artery calcium score 2565 Agatston units.

Coronary Arteries: Left dominant with no anomalies

LM: Calcified plaque, mild stenosis.

LAD system: Very heavily calcified proximal and mid LAD. Blooming
artifact makes this difficult to assess. Cannot rule out
hemodynamically significant stenosis.

Circumflex system: Calcified plaque in the proximal LCx, looks like
mild stenosis. LCx is dominant, provides left PDA.

RCA system: Small, nondominant vessel. There is some calcified
plaque, nonobstructive.
IMPRESSION: 1. Coronary artery calcium score 2565 Agatston units, placing the
patient in the 81st percentile for age and gender, suggesting high
risk for future cardiac events.

2.  Dominant LCx, suspect mild stenosis proximally.

3. Dense calcification of the proximal and mid LAD. Given blooming
artifact, cannot rule out hemodynamically significant stenosis. Will
send for CT FFR.

Bracha Ogando

EXAM:
OVER-READ INTERPRETATION  CT CHEST

The following report is an over-read performed by radiologist Dr.
Csokis Dallos [REDACTED] on 06/06/2017. This
over-read does not include interpretation of cardiac or coronary
anatomy or pathology. The coronary calcium score/coronary CTA
interpretation by the cardiologist is attached.
FINDINGS: Aortic atherosclerosis. Within the visualized portions of the thorax
there are no suspicious appearing pulmonary nodules or masses, there
is no acute consolidative airspace disease, no pleural effusions, no
pneumothorax and no lymphadenopathy. Visualized portions of the
upper abdomen are unremarkable. There are no aggressive appearing
lytic or blastic lesions noted in the visualized portions of the
skeleton.
IMPRESSION: Aortic Atherosclerosis ([id number]).
